# Patient Record
Sex: Female | Born: 1985 | Race: White | Hispanic: No | Smoking: Never smoker
Health system: Southern US, Community
[De-identification: ages and names within clinical notes are randomized; demographics above are authoritative.]

## PROBLEM LIST (undated history)

## (undated) HISTORY — PX: CHOLECYSTECTOMY: SHX55

---
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
## 2007-06-18 ENCOUNTER — Emergency Department: Payer: Self-pay | Admitting: Emergency Medicine

## 2008-05-13 ENCOUNTER — Emergency Department: Payer: Self-pay | Admitting: Emergency Medicine

## 2008-05-17 ENCOUNTER — Emergency Department: Payer: Self-pay | Admitting: Emergency Medicine

## 2008-06-16 ENCOUNTER — Emergency Department: Payer: Self-pay | Admitting: Emergency Medicine

## 2008-06-17 ENCOUNTER — Observation Stay: Payer: Self-pay | Admitting: Surgery

## 2008-11-06 ENCOUNTER — Emergency Department: Payer: Self-pay | Admitting: Internal Medicine

## 2009-05-17 ENCOUNTER — Emergency Department: Payer: Self-pay | Admitting: Emergency Medicine
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
## 2010-04-29 ENCOUNTER — Emergency Department: Payer: Self-pay | Admitting: Emergency Medicine

## 2011-01-24 ENCOUNTER — Ambulatory Visit: Payer: Self-pay | Admitting: Advanced Practice Midwife

## 2011-04-06 ENCOUNTER — Observation Stay: Payer: Self-pay

## 2011-05-28 ENCOUNTER — Observation Stay: Payer: Self-pay | Admitting: Obstetrics and Gynecology

## 2011-05-31 ENCOUNTER — Inpatient Hospital Stay: Payer: Self-pay | Admitting: Obstetrics and Gynecology

## 2011-06-10 LAB — PATHOLOGY REPORT

## 2012-08-22 ENCOUNTER — Emergency Department: Payer: Self-pay | Admitting: Emergency Medicine

## 2012-08-22 LAB — CBC
MCH: 27.5 pg (ref 26.0–34.0)
MCHC: 33.5 g/dL (ref 32.0–36.0)
MCV: 82 fL (ref 80–100)
Platelet: 204 10*3/uL (ref 150–440)
RBC: 4.27 10*6/uL (ref 3.80–5.20)
RDW: 13.9 % (ref 11.5–14.5)
WBC: 8.8 10*3/uL (ref 3.6–11.0)

## 2012-08-22 LAB — HCG, QUANTITATIVE, PREGNANCY: Beta Hcg, Quant.: 53693 m[IU]/mL — ABNORMAL HIGH

## 2012-11-05 ENCOUNTER — Ambulatory Visit: Payer: Self-pay | Admitting: Family Medicine

## 2013-01-11 ENCOUNTER — Observation Stay: Payer: Self-pay | Admitting: Obstetrics and Gynecology

## 2013-01-12 LAB — URINALYSIS, COMPLETE
Bacteria: NONE SEEN
Ketone: NEGATIVE
Leukocyte Esterase: NEGATIVE
Nitrite: NEGATIVE
Ph: 9 (ref 4.5–8.0)
Specific Gravity: 1.018 (ref 1.003–1.030)
WBC UR: 1 /HPF (ref 0–5)

## 2013-01-12 LAB — COMPREHENSIVE METABOLIC PANEL
BUN: 6 mg/dL — ABNORMAL LOW (ref 7–18)
Bilirubin,Total: 0.5 mg/dL (ref 0.2–1.0)
Chloride: 108 mmol/L — ABNORMAL HIGH (ref 98–107)
Co2: 24 mmol/L (ref 21–32)
EGFR (African American): >60
Osmolality: 273 (ref 275–301)
Potassium: 3.5 mmol/L (ref 3.5–5.1)
SGOT(AST): 52 U/L — ABNORMAL HIGH (ref 15–37)
SGPT (ALT): 32 U/L (ref 12–78)
Sodium: 138 mmol/L (ref 136–145)

## 2013-01-12 LAB — AMYLASE: Amylase: 33 U/L (ref 25–115)

## 2013-01-30 ENCOUNTER — Observation Stay: Payer: Self-pay | Admitting: Obstetrics and Gynecology

## 2013-01-31 LAB — URINALYSIS, COMPLETE
Ketone: NEGATIVE
Nitrite: NEGATIVE
Ph: 7 (ref 4.5–8.0)
RBC,UR: <1 /HPF (ref 0–5)

## 2013-02-11 ENCOUNTER — Observation Stay: Payer: Self-pay

## 2013-02-18 ENCOUNTER — Observation Stay: Payer: Self-pay | Admitting: Obstetrics and Gynecology

## 2013-03-04 ENCOUNTER — Observation Stay: Payer: Self-pay

## 2013-03-15 ENCOUNTER — Observation Stay: Payer: Self-pay

## 2013-03-16 ENCOUNTER — Inpatient Hospital Stay: Payer: Self-pay

## 2013-03-16 LAB — CBC WITH DIFFERENTIAL/PLATELET
Basophil #: 0 10*3/uL (ref 0.0–0.1)
Basophil %: 0.3 %
Eosinophil %: 2.7 %
HCT: 32.6 % — ABNORMAL LOW (ref 35.0–47.0)
Lymphocyte #: 1.8 10*3/uL (ref 1.0–3.6)
MCH: 25.1 pg — ABNORMAL LOW (ref 26.0–34.0)
Neutrophil #: 8.1 10*3/uL — ABNORMAL HIGH (ref 1.4–6.5)
RDW: 14.8 % — ABNORMAL HIGH (ref 11.5–14.5)
WBC: 10.8 10*3/uL (ref 3.6–11.0)

## 2013-03-17 LAB — PATHOLOGY REPORT

## 2013-03-17 LAB — HEMATOCRIT: HCT: 27.8 % — ABNORMAL LOW (ref 35.0–47.0)

## 2013-11-08 IMAGING — US US OB < 14 WEEKS - US OB TV
1 series · 14 of 28 positions shown · non-contrast
Comparison: none

REASON FOR EXAM: abd cramping/spotting
COMMENTS:

[Series 1: us ob < 14 weeks - us ob tv · 0.28mm/px · 14 of 108 slices shown]
[im 4/108]
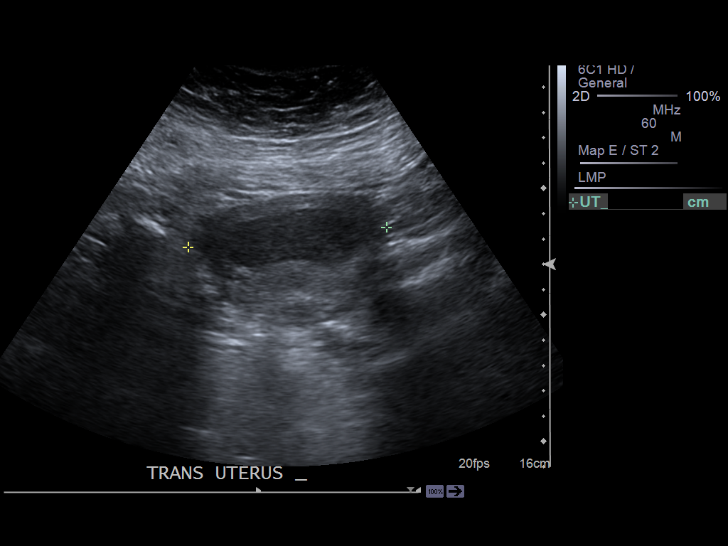
[im 12/108]
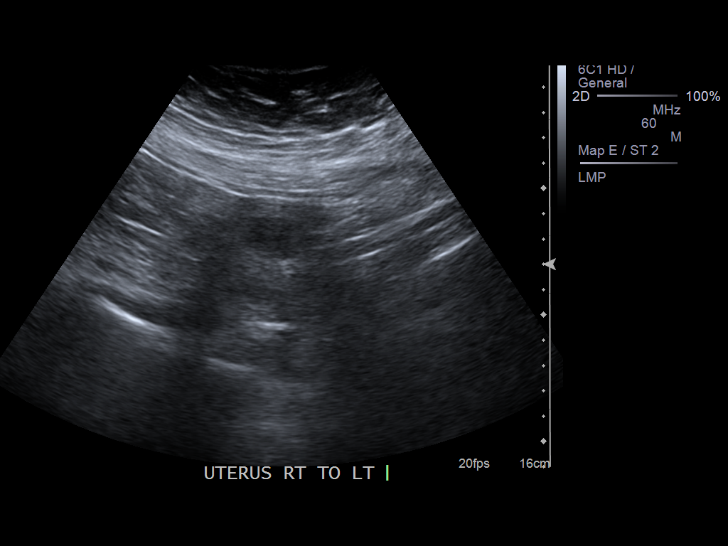
[im 20/108]
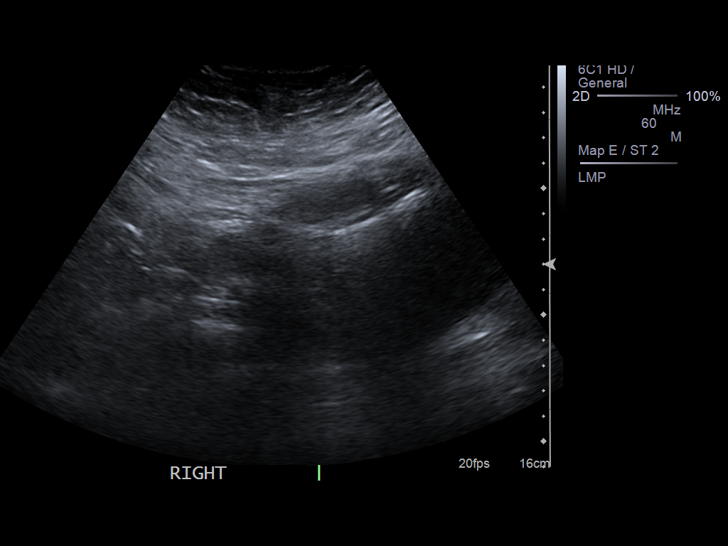
[im 28/108]
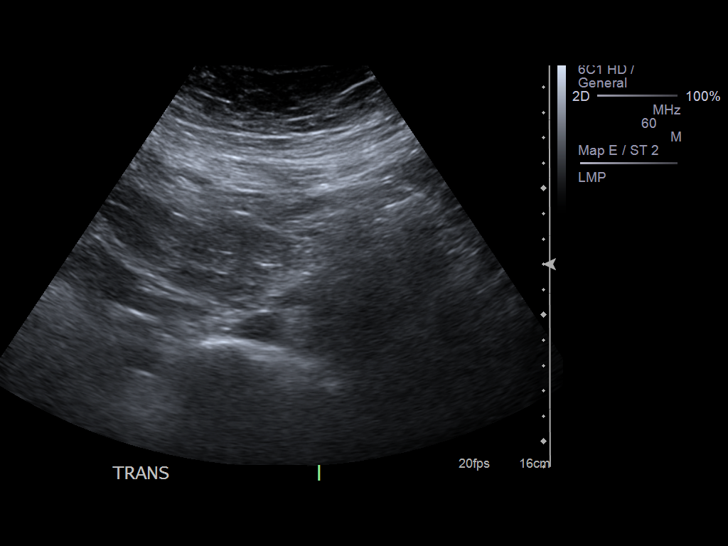
[im 36/108]
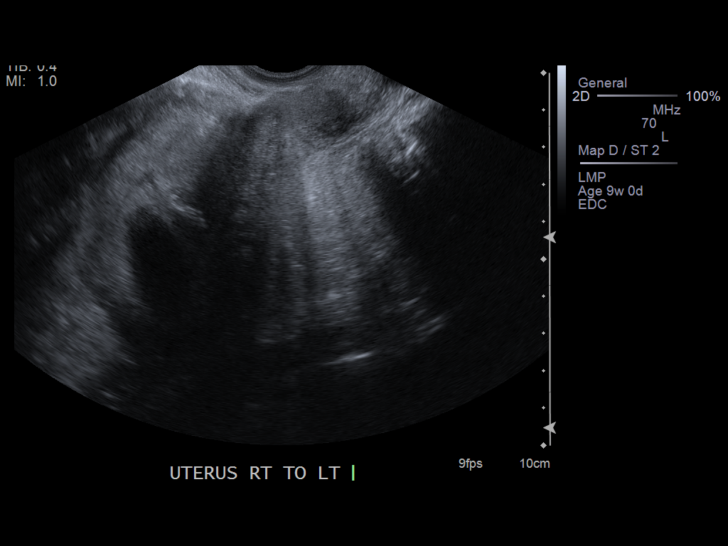
[im 44/108]
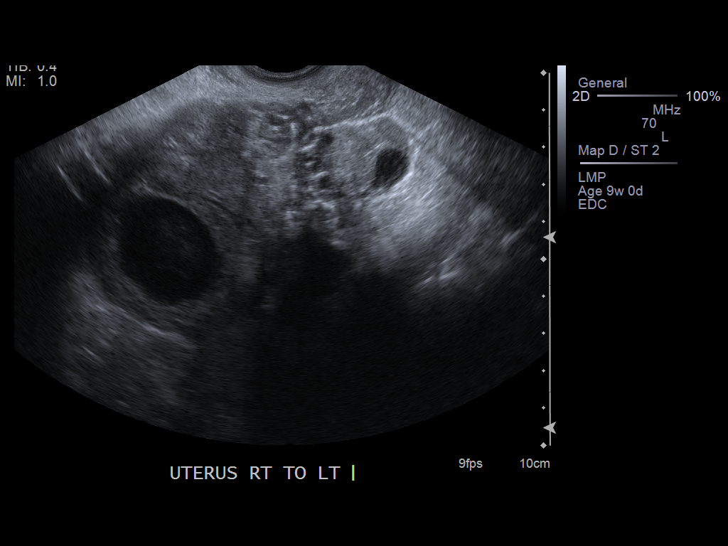
[im 52/108]
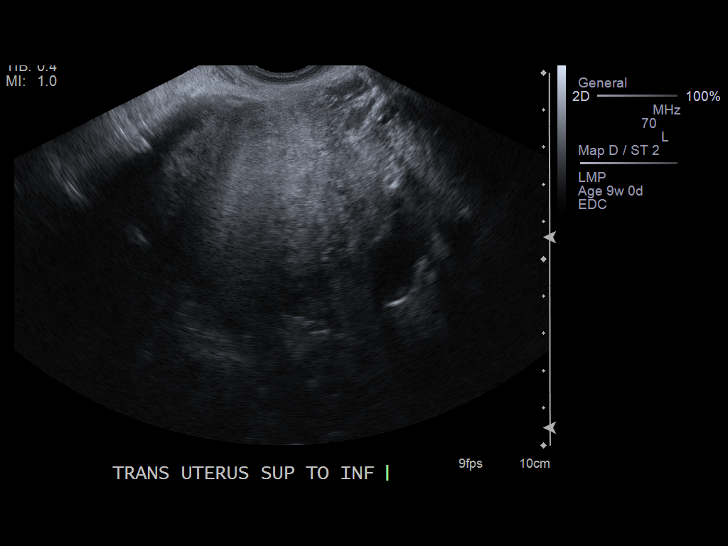
[im 60/108]
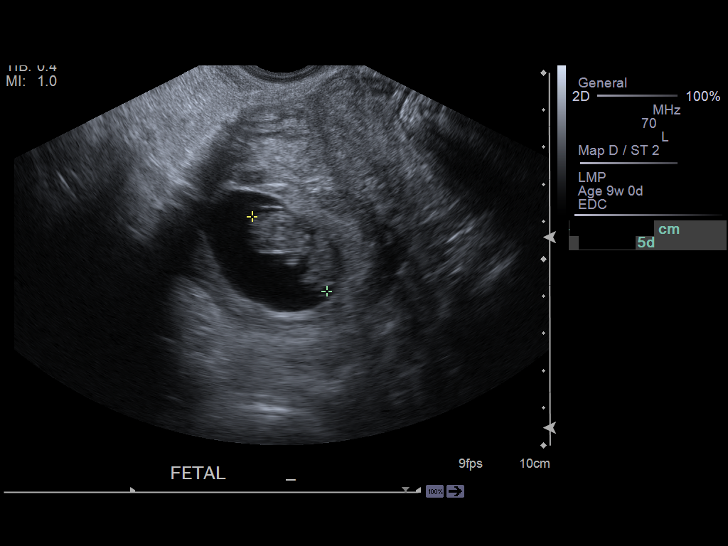
[im 68/108]
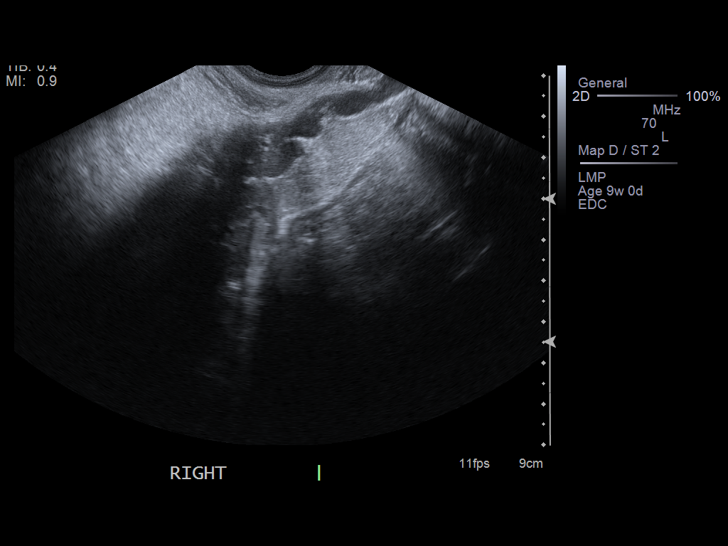
[im 76/108]
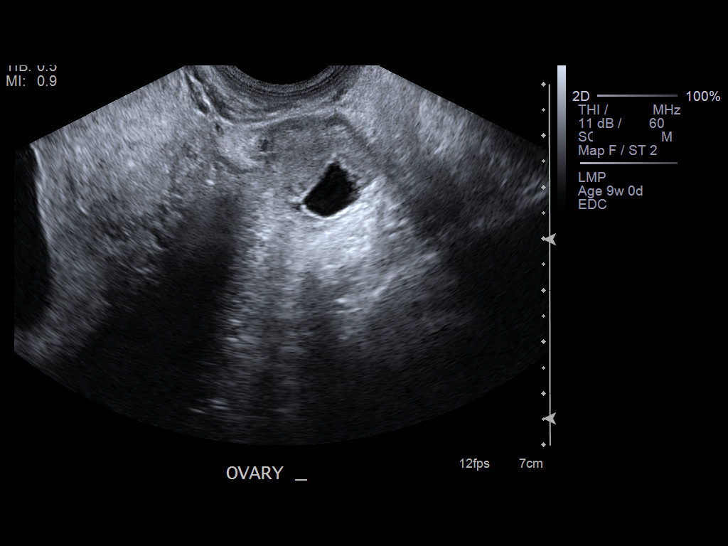
[im 84/108]
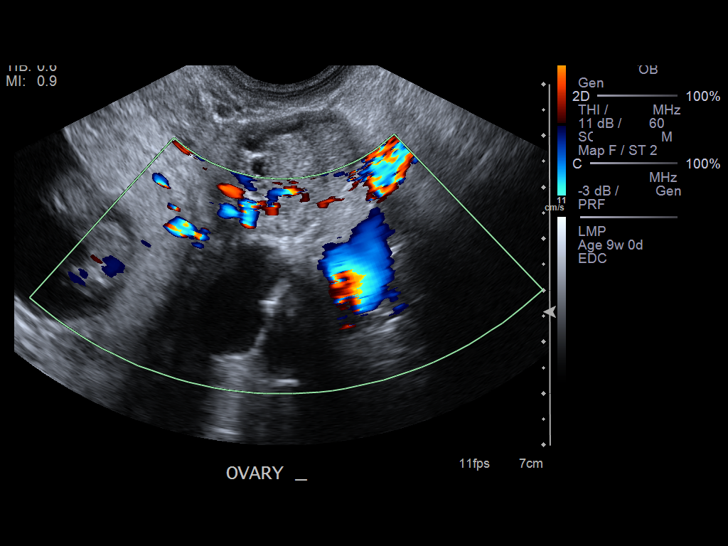
[im 92/108]
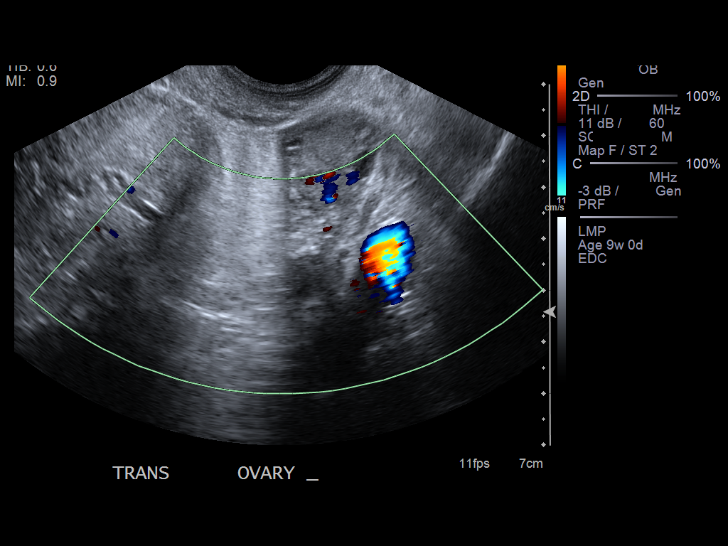
[im 100/108]
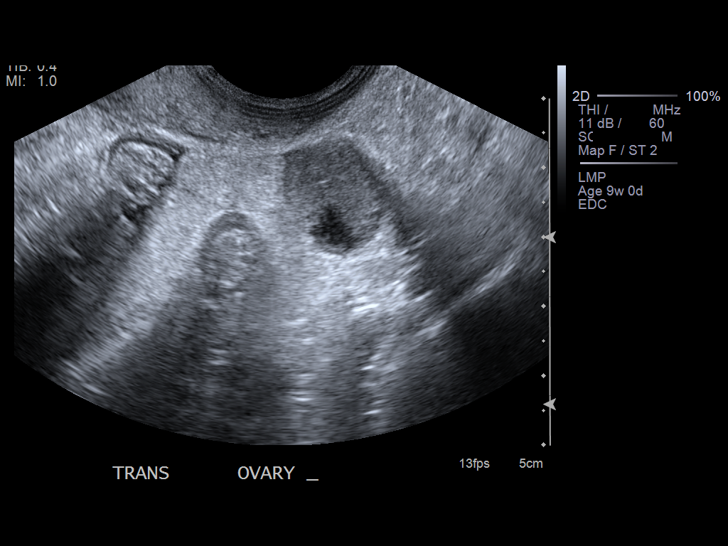
[im 108/108]
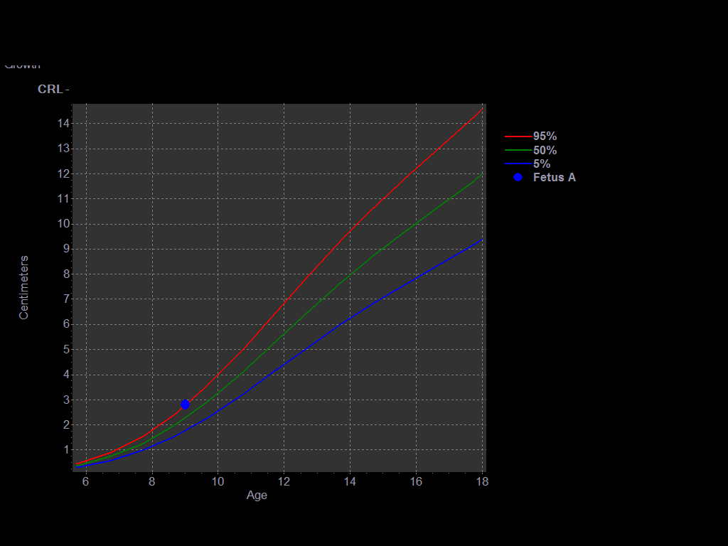

[14 of 28 positions shown; findings below may reference images not displayed]

PROCEDURE:     US  - US OB LESS THAN 14 WEEKS/W TRANS  - August 22, 2012  [DATE]

RESULT:     The patient reports being in the first trimester of pregnancy.
The patient's beta-hCG value is [DATE]. There is a gravid uterus present.
The crown right length measures an average of 2.8 cm corresponding to a 9
week 4 day gestation + / -6 days. A fetal cardiac rate of 153 beats per minute
is demonstrated.

The right ovary was not visualized. The left ovary measures 3.4 x 1.9 x
cm and exhibits a cystic structure which measures 2.7 cm in greatest
dimension. It does not exhibit rim hypervascularity. Survey views of the
kidneys are normal.
IMPRESSION: 1. There is an normal appearing viable IUP with estimated gestational age of
9 weeks 4 days + / -6 days. This corresponds to an estimated date of
confinement March 23, 2013.
2. There is a cyst associated with the left ovary. The right ovary was not
visualized.

[REDACTED]

## 2014-03-31 IMAGING — US ABDOMEN ULTRASOUND LIMITED
1 series · 14 of 25 positions shown · non-contrast
Comparison: none

REASON FOR EXAM: nausea, vomitting, upper abdominal pain
COMMENTS:   Body Site: GB and Fossa, CBD, Head of Pancreas

[Series 1: abdomen ultrasound limited · 0.31mm/px · 14 of 56 slices shown]
[im 1/56]
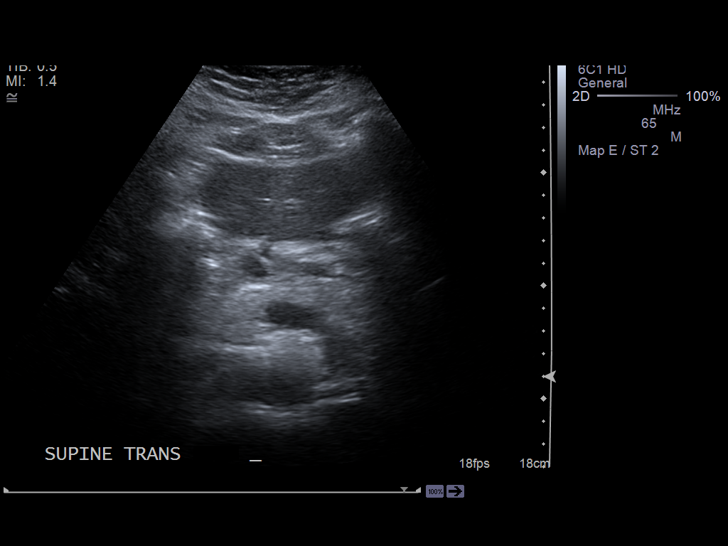
[im 5/56]
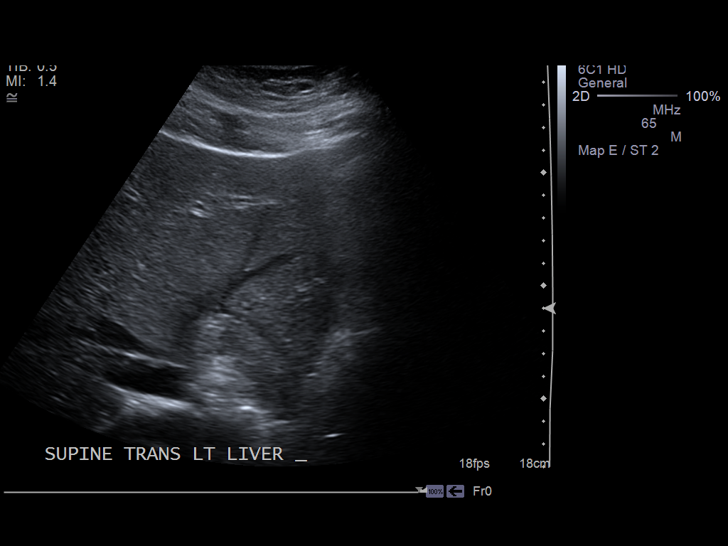
[im 10/56]
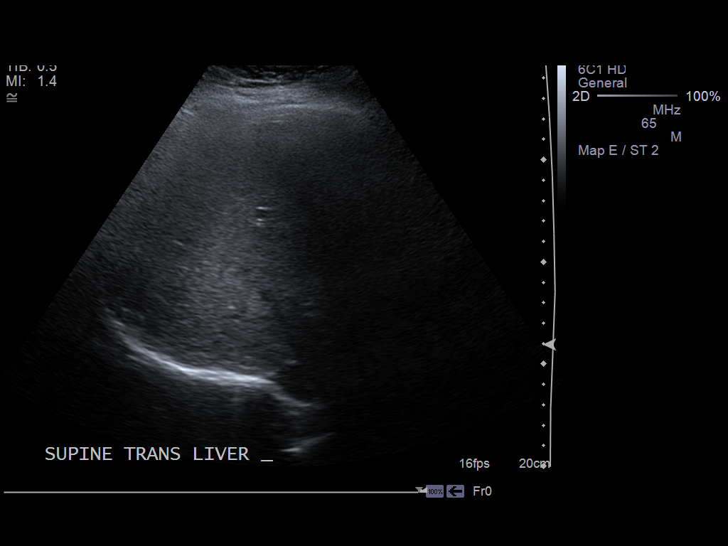
[im 14/56]
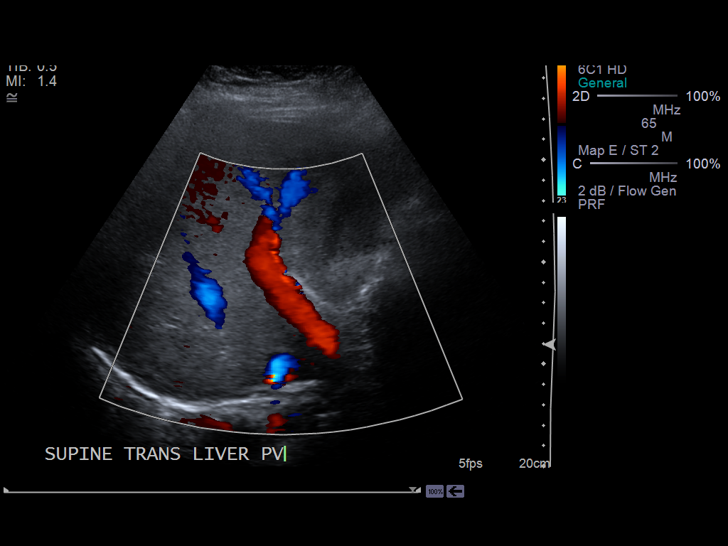
[im 19/56]
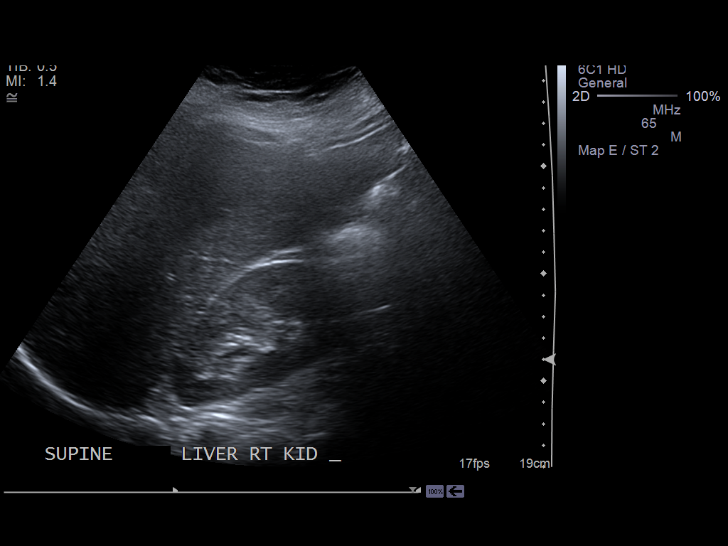
[im 21/56]
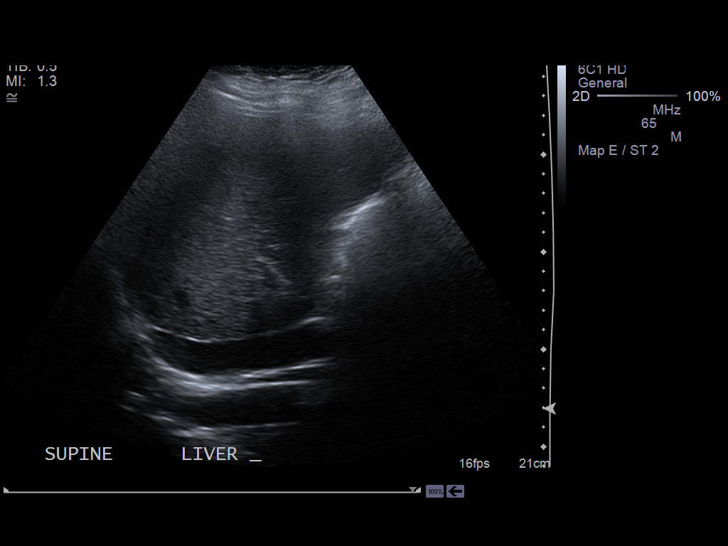
[im 26/56]
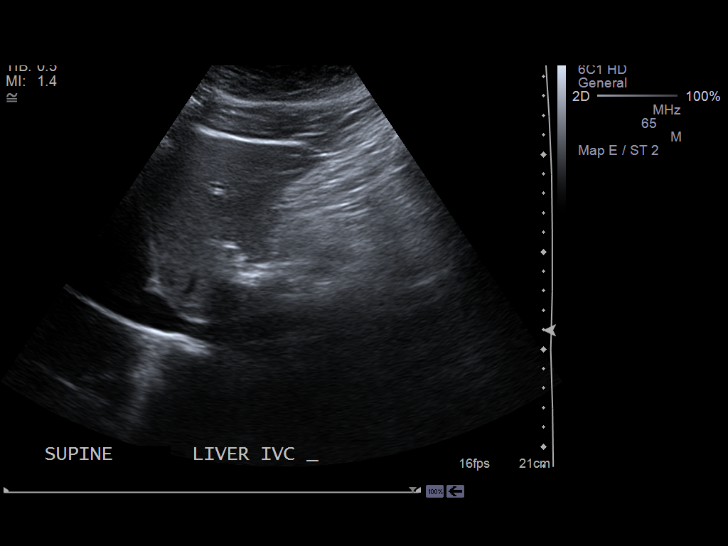
[im 30/56]
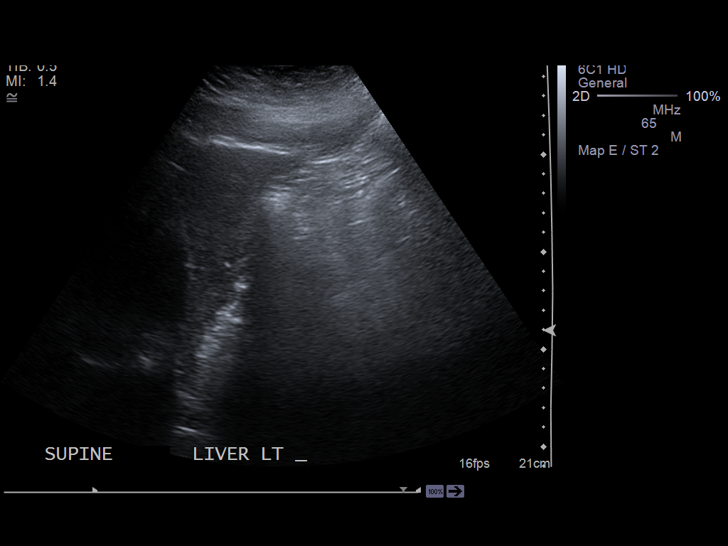
[im 35/56]
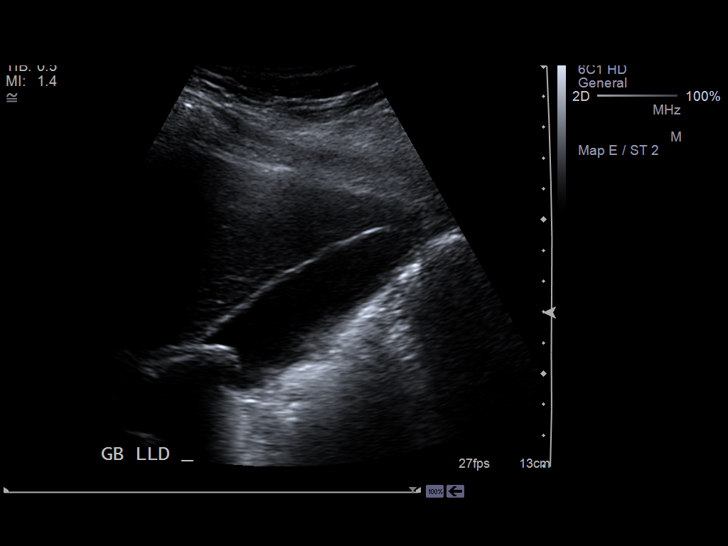
[im 37/56]
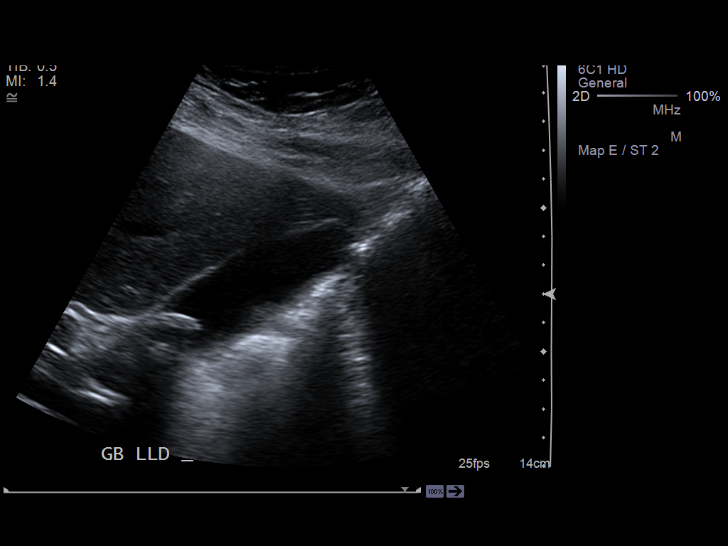
[im 42/56]
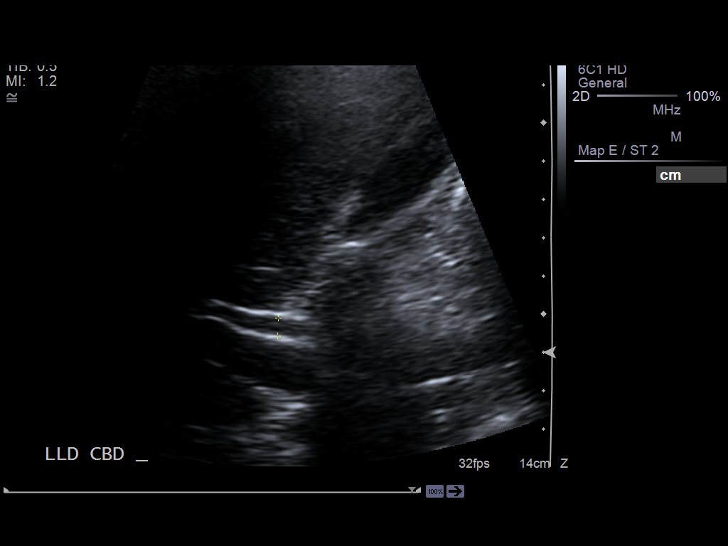
[im 46/56]
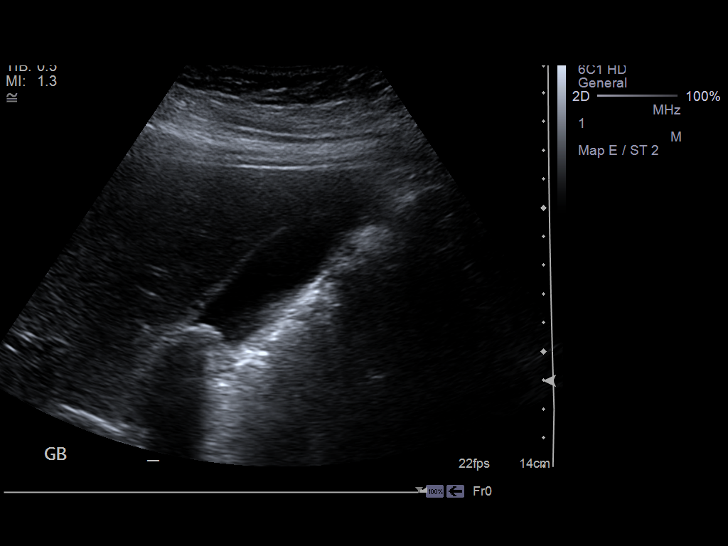
[im 51/56]
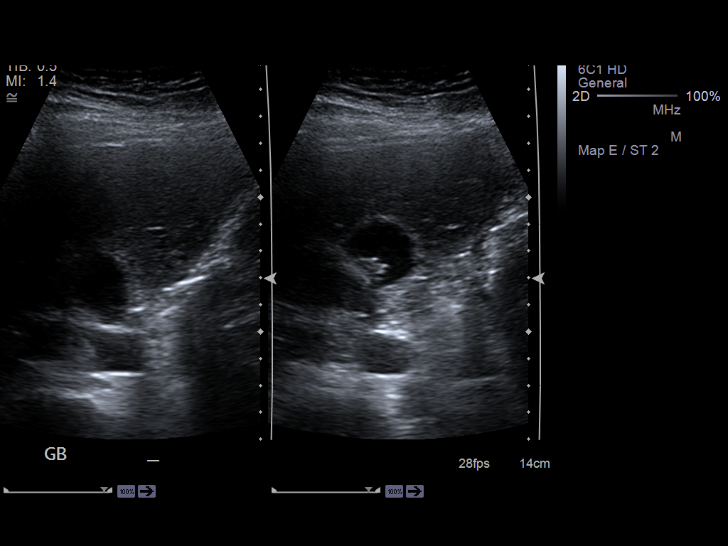
[im 56/56]
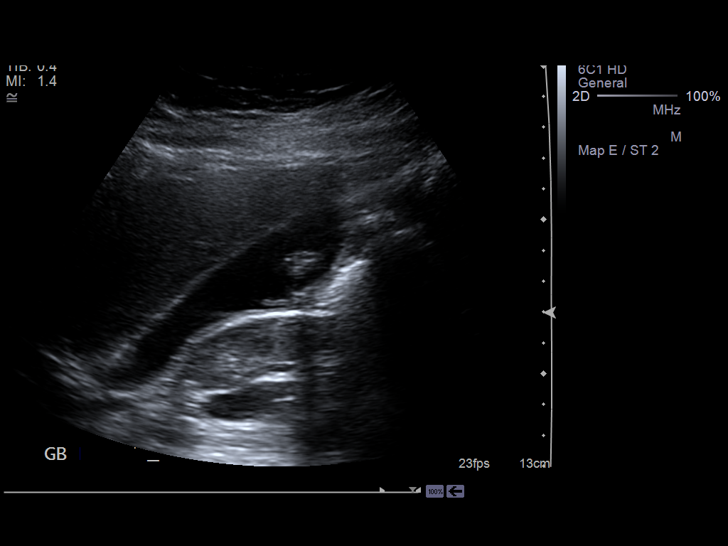

[14 of 25 positions shown; findings below may reference images not displayed]

PROCEDURE:     US  - US ABDOMEN LIMITED SURVEY  - January 12, 2013  [DATE]

RESULT:     Limited right upper quadrant abdominal sonogram is performed.
The patient has no previous study for comparison. The visualized portions of
the pancreas appear to be normal. The tail was not well demonstrated. The
portal venous flow is normal. The hepatic echotexture is normal. The liver
length is abnormal at 19.13 cm. There is no ascites demonstrated. Proximal
inferior vena cava is patent. Gallbladder wall thickness is 2.6 mm. There
appears to be at least one large stone in the gallbladder which is mobile
with changes in patient position measuring up to 1.2 cm in length. There is
a negative sonographic Murphy's sign. The common bile duct diameter is
mm. A second stone is also seen on some of the images.
IMPRESSION: Large gallstones present. No evidence of acute
cholecystitis. The liver is elongated and enlarged at 19.13 cm.

[REDACTED]

## 2015-02-24 NOTE — Op Note (Signed)
PATIENT NAME:  Sierra Ibarra, Sierra Ibarra MR#:  [id number] DATE OF BIRTH:  [birth year]  DATE OF PROCEDURE:  03/16/2013  PREOPERATIVE DIAGNOSES:  1. Elective repeat cesarean section.  2. Active labor.  3. Elective sterilization.   POSTOPERATIVE DIAGNOSES:  1. Elective repeat cesarean section.  2. Active labor.  3. Elective sterilization.   PROCEDURE:  1. Repeat low transverse cesarean section. 2. Partial left salpingectomy.   SURGEON: Suzy Bouchard, MD   FIRST ASSISTANT: Street, scrub tech.   ANESTHESIA: Spinal.   INDICATIONS: This is a 29 year old gravida 3, para 1 patient with a prior cesarean section scheduled for a cesarean section with the EDC of 03/23/2013. The patient presented to Labor and Delivery the day of the procedure in active labor, cervix 5 cm. The patient reconfirms the desire for permanent sterilization. The patient has had a prior partial right salpingectomy and is scheduled for a partial left salpingectomy.   DESCRIPTION OF PROCEDURE: After adequate spinal anesthesia, the patient was placed in the dorsal supine position, hip roll under the right side. The patient's abdomen was prepped and draped in normal sterile fashion. A Pfannenstiel incision was made two fingerbreadths above the symphysis pubis. Sharp dissection was used to identify the fascia. The fascia was opened in the midline and opened in a transverse fashion. The superior aspect of the fascia was grasped with Kocher clamps and the rectus muscles were dissected free. The inferior aspect of the fascia was grasped with Kocher clamps and pyramidalis muscle was dissected free. The inferior peritoneum was entered sharply. There were several strands of omentum adhesed to the anterior abdominal wall and the uterus. These were taken down with the Bovie. The vesicouterine peritoneal fold was identified. A small bladder flap was created and the bladder was reflected inferiorly. A low transverse uterine incision was  made. Upon entry into the endometrial cavity, clear fluid resulted. The incision was extended with blunt transverse traction. The fetal head was brought to the incision and the infant was delivered without difficulty. A vigorous female was passed to Dr. Beckie Salts who assigned Apgar scores of 9 and 9, weight 3,540 grams. Placenta was manually removed after getting cord blood and the uterus was exteriorized. The endometrial cavity was wiped clean with the laparotomy tape and the cervix was opened with a ring forceps and this was passed off the operative field. The uterine incision was closed with 1 chromic suture in a running locking fashion with good approximation of edges, good hemostasis noted.   Attention was directed to the patient's right fallopian tube where a prior partial salpingectomy was identified. This was known before the surgery. Therefore, surgeon proceeded to the left fallopian tube which was grasped at the mid-portion of the fallopian tube. Two separate 0 plain gut sutures were applied to the fallopian tube and a partial left salpingectomy was performed. Good hemostasis was noted. Ovaries appeared normal. Posterior cul-de-sac suctioned. The uterus was placed back into the abdominal cavity and the paracolic gutters were wiped clean with the laparotomy tape. Tubal ligation site again appeared hemostatic and the uterine incision appeared hemostatic. Interceed was placed over the uterine incision in a T-shape fashion and attention then was directed to the superior aspect of the fascia which was grasped with Kocher clamps and the On-Q pump catheters were placed from an infraumbilical position to subfascial position. The fascia was then closed over top of these without difficulty. Good hemostasis was noted. Subcutaneous tissues were irrigated and Bovied for hemostasis and the skin was reapproximated  with staples. The On-Q pump catheters were secured at the skin level with Dermabond, Steri-Stripped and  Tegaderm placed. Each catheter was loaded with 5 mL of 0.5% Marcaine. There were no complications. Estimated blood loss 600 mL. Intraoperative fluids 1,000 mL. The patient did receive 3 grams IV Ancef prior to commencement of the case and she did receive IV Pitocin after the delivery of the placenta. The patient was taken to the recovery room in good condition.   ____________________________ Suzy Bouchardhomas J. Schermerhorn, MD tjs:es D: 03/16/2013 10:05:00 ET T: 03/16/2013 10:28:10 ET JOB#: [id number]  cc: Suzy Bouchardhomas J. Schermerhorn, MD, <Dictator> Suzy BouchardHOMAS J SCHERMERHORN MD ELECTRONICALLY SIGNED 03/22/2013 13:58

## 2015-03-14 NOTE — H&P (Signed)
L&D Evaluation:  History:  HPI 29 y/o G3P1011 @ 39wks EDC 03/23/13 arrived during the night with c/o irregular contractions, denies leaking fluid or vaginal bleeding, baby active. Care @ [facility] Previous Csection with Rt salpingetomy, obesity, CMZ and GC RX'd, late entry to care HX Gallstones.   Presents with contractions   Patient's Medical History No Chronic Illness   Patient's Surgical History Previous C-Section   Medications Pre Natal Vitamins   Allergies PCN   Social History none   Family History Non-Contributory   ROS:  ROS All systems were reviewed.  HEENT, CNS, GI, GU, Respiratory, CV, Renal and Musculoskeletal systems were found to be normal.   Exam:  Vital Signs stable   Urine Protein not completed   General no apparent distress   Mental Status clear   Chest clear   Heart normal sinus rhythm   Abdomen gravid, non-tender   Estimated Fetal Weight Average for gestational age   Fetal Position vtx   Fundal Height term   Back no CVAT   Edema no edema   Clonus negative   Pelvic no external lesions, 5cm 90% vtx @ -2 BBOW sm show   Mebranes Intact   FHT normal rate with no decels, 120's 130's avg variability with accels   Fetal Heart Rate 135   Ucx irregular, Q 2/3 mins 45/60 sec moderate   Skin dry   Lymph no lymphadenopathy   Impression:  Impression active labor   Plan:  Plan EFM/NST, monitor contractions and for cervical change, fluids   Comments TJS notified, csection called. DC plan of care with patient and father of baby.Questions answered, what to expect with repeeat csection. DC Tubal ligation medicaid paper dated 02/18/13.   Electronic Signatures: Albertina ParrLugiano, Jeanna Giuffre B (CNM)  (Signed 13-May-14 08:34)  Authored: L&D Evaluation   Last Updated: 13-May-14 08:34 by Albertina ParrLugiano, Gissell Barra B (CNM)

## 2020-01-10 ENCOUNTER — Other Ambulatory Visit: Payer: Self-pay

## 2020-01-10 ENCOUNTER — Emergency Department
Admission: EM | Admit: 2020-01-10 | Discharge: 2020-01-10 | Discharge status: Against Medical Advice | Payer: Medicaid Other | Attending: Emergency Medicine | Admitting: Emergency Medicine

## 2020-01-10 DIAGNOSIS — R55 Syncope and collapse: Secondary | ICD-10-CM | POA: Diagnosis not present

## 2020-01-10 DIAGNOSIS — Z5321 Procedure and treatment not carried out due to patient leaving prior to being seen by health care provider: Secondary | ICD-10-CM | POA: Insufficient documentation

## 2020-01-10 LAB — BASIC METABOLIC PANEL
Anion gap: 8 (ref 5–15)
BUN: 9 mg/dL (ref 6–20)
CO2: 22 mmol/L (ref 22–32)
Calcium: 8.6 mg/dL — ABNORMAL LOW (ref 8.9–10.3)
Chloride: 105 mmol/L (ref 98–111)
Creatinine, Ser: 0.53 mg/dL (ref 0.44–1.00)
GFR calc Af Amer: >60 mL/min (ref >60)
GFR calc non Af Amer: >60 mL/min (ref >60)
Glucose, Bld: 121 mg/dL — ABNORMAL HIGH (ref 70–99)
Potassium: 3.5 mmol/L (ref 3.5–5.1)
Sodium: 135 mmol/L (ref 135–145)

## 2020-01-10 LAB — CBC
HCT: 34.8 % — ABNORMAL LOW (ref 36.0–46.0)
Hemoglobin: 10.9 g/dL — ABNORMAL LOW (ref 12.0–15.0)
MCH: 24.9 pg — ABNORMAL LOW (ref 26.0–34.0)
MCHC: 31.3 g/dL (ref 30.0–36.0)
MCV: 79.5 fL — ABNORMAL LOW (ref 80.0–100.0)
Platelets: 264 10*3/uL (ref 150–400)
RBC: 4.38 MIL/uL (ref 3.87–5.11)
RDW: 13.7 % (ref 11.5–15.5)
WBC: 7 10*3/uL (ref 4.0–10.5)
nRBC: 0 % (ref 0.0–0.2)
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
## 2020-01-10 NOTE — ED Notes (Signed)
No answer when called several times from lobby 

## 2020-01-10 NOTE — ED Notes (Signed)
No answer when called several times from lobby; attempted to call phone with no answer as well

## 2020-01-13 ENCOUNTER — Telehealth: Payer: Self-pay | Admitting: Emergency Medicine

## 2020-01-13 NOTE — Telephone Encounter (Signed)
Called patient due to lwot to inquire about condition and follow up plans. Left message.   

## 2020-06-27 ENCOUNTER — Emergency Department: Payer: Medicaid Other

## 2020-06-27 ENCOUNTER — Encounter: Payer: Self-pay | Admitting: Emergency Medicine

## 2020-06-27 ENCOUNTER — Other Ambulatory Visit: Payer: Self-pay

## 2020-06-27 ENCOUNTER — Emergency Department
Admission: EM | Admit: 2020-06-27 | Discharge: 2020-06-27 | Disposition: A | Discharge status: Home / Self Care | Payer: Medicaid Other | Attending: Emergency Medicine | Admitting: Emergency Medicine

## 2020-06-27 DIAGNOSIS — U071 COVID-19: Secondary | ICD-10-CM | POA: Insufficient documentation

## 2020-06-27 DIAGNOSIS — R079 Chest pain, unspecified: Secondary | ICD-10-CM | POA: Diagnosis not present

## 2020-06-27 LAB — COMPREHENSIVE METABOLIC PANEL
ALT: 15 U/L (ref 0–44)
AST: 23 U/L (ref 15–41)
Albumin: 3.8 g/dL (ref 3.5–5.0)
Alkaline Phosphatase: 45 U/L (ref 38–126)
Anion gap: 8 (ref 5–15)
BUN: 9 mg/dL (ref 6–20)
CO2: 26 mmol/L (ref 22–32)
Calcium: 8.2 mg/dL — ABNORMAL LOW (ref 8.9–10.3)
Chloride: 105 mmol/L (ref 98–111)
Creatinine, Ser: 0.55 mg/dL (ref 0.44–1.00)
GFR calc Af Amer: >60 mL/min (ref >60)
GFR calc non Af Amer: >60 mL/min (ref >60)
Glucose, Bld: 122 mg/dL — ABNORMAL HIGH (ref 70–99)
Potassium: 3.4 mmol/L — ABNORMAL LOW (ref 3.5–5.1)
Sodium: 139 mmol/L (ref 135–145)
Total Bilirubin: 0.5 mg/dL (ref 0.3–1.2)
Total Protein: 7.2 g/dL (ref 6.5–8.1)

## 2020-06-27 LAB — CBC WITH DIFFERENTIAL/PLATELET
Abs Immature Granulocytes: 0.02 10*3/uL (ref 0.00–0.07)
Basophils Absolute: 0 10*3/uL (ref 0.0–0.1)
Basophils Relative: 1 %
Eosinophils Absolute: 0.2 10*3/uL (ref 0.0–0.5)
Eosinophils Relative: 4 %
HCT: 33.6 % — ABNORMAL LOW (ref 36.0–46.0)
Hemoglobin: 10.7 g/dL — ABNORMAL LOW (ref 12.0–15.0)
Immature Granulocytes: 1 %
Lymphocytes Relative: 27 %
Lymphs Abs: 1 10*3/uL (ref 0.7–4.0)
MCH: 24.9 pg — ABNORMAL LOW (ref 26.0–34.0)
MCHC: 31.8 g/dL (ref 30.0–36.0)
MCV: 78.3 fL — ABNORMAL LOW (ref 80.0–100.0)
Monocytes Absolute: 0.3 10*3/uL (ref 0.1–1.0)
Monocytes Relative: 7 %
Neutro Abs: 2.3 10*3/uL (ref 1.7–7.7)
Neutrophils Relative %: 60 %
Platelets: 202 10*3/uL (ref 150–400)
RBC: 4.29 MIL/uL (ref 3.87–5.11)
RDW: 14.5 % (ref 11.5–15.5)
WBC: 3.8 10*3/uL — ABNORMAL LOW (ref 4.0–10.5)
nRBC: 0 % (ref 0.0–0.2)

## 2020-06-27 LAB — TROPONIN I (HIGH SENSITIVITY): Troponin I (High Sensitivity): <2 ng/L (ref <18)

## 2020-06-27 MED ORDER — GUAIFENESIN-CODEINE 100-10 MG/5ML PO SOLN: 5.0000 mL | Freq: Four times a day (QID) | ORAL | 0 refills | Status: DC | PRN

## 2020-06-27 MED ORDER — ONDANSETRON HCL 4 MG PO TABS: 4.0000 mg | ORAL_TABLET | Freq: Every day | ORAL | 0 refills | Status: DC | PRN

## 2020-06-27 NOTE — ED Notes (Signed)
Tested positive COVID 06/26/20
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
## 2020-06-27 NOTE — ED Notes (Signed)
ED Provider at bedside. 

## 2020-06-27 NOTE — ED Provider Notes (Signed)
Sierra Ibarra ([place]) Emergency Department Provider Note  Time seen: 8:30 AM  I have reviewed the triage vital signs and the nursing notes.   HISTORY  Chief Complaint Chest Pain   HPI Sierra Ibarra is a 34 y.o. female with no past medical history presents to the emergency department for chest discomfort and mild shortness of breath. Patient tested positive for Covid recently.  According to the patient he had a known close exposure at work and everyone in the office was tested. Patient was informed yesterday she tested positive. Patient states she started having some chest pain and shortness of breath last night so she came to the emergency department today for evaluation. Denies any significant cough denies any nausea vomiting no abdominal pain.  History reviewed. No pertinent past medical history.  There are no problems to display for this patient.   History reviewed. No pertinent surgical history.  Prior to Admission medications   Not on File    Allergies  Allergen Reactions  . Amoxicillin   . Penicillins     No family history on file.  Social History Social History   Tobacco Use  . Smoking status: Never Smoker  . Smokeless tobacco: Never Used  Vaping Use  . Vaping Use: Never used  Substance Use Topics  . Alcohol use: Yes    Comment: occasional   . Drug use: Yes    Types: Marijuana    Review of Systems Constitutional: Negative for fever. Cardiovascular: Mild chest discomfort/tightness Respiratory: Mild shortness of breath Gastrointestinal: Negative for abdominal pain, vomiting Musculoskeletal: Negative for musculoskeletal complaints Skin: Negative for skin complaints  Neurological: Negative for headache All other ROS negative  ____________________________________________   PHYSICAL EXAM:  VITAL SIGNS: ED Triage Vitals  Enc Vitals Group     BP 06/27/20 0545 117/72     Pulse Rate 06/27/20 0545 72     Resp 06/27/20 0545 18      Temp 06/27/20 0545 97.9 F (36.6 C)     Temp Source 06/27/20 0545 Oral     SpO2 06/27/20 0545 99 %     Weight 06/27/20 0546 240 lb (108.9 kg)     Height 06/27/20 0546 5\' 2"  (1.575 m)     Head Circumference --      Peak Flow --      Pain Score 06/27/20 0545 5     Pain Loc --      Pain Edu? --      Excl. in GC? --     Constitutional: Alert and oriented. Well appearing and in no distress. Eyes: Normal exam ENT      Head: Normocephalic and atraumatic.      Mouth/Throat: Mucous membranes are moist. Cardiovascular: Normal rate, regular rhythm. No murmur Respiratory: Normal respiratory effort without tachypnea nor retractions. Breath sounds are clear  Gastrointestinal: Soft and nontender. No distention.  Musculoskeletal: Nontender with normal range of motion in all extremities.  Neurologic:  Normal speech and language. No gross focal neurologic deficits  Skin:  Skin is warm, dry and intact.  Psychiatric: Mood and affect are normal. Speech and behavior are normal.   ____________________________________________    EKG  EKG viewed and interpreted by myself shows a normal sinus rhythm at 73 bpm with a narrow QRS, normal axis, normal intervals, no concerning ST changes. Normal EKG.  ____________________________________________    RADIOLOGY  Chest x-ray is clear  ____________________________________________   INITIAL IMPRESSION / ASSESSMENT AND PLAN / ED COURSE  Pertinent labs &  imaging results that were available during my care of the patient were reviewed by me and considered in my medical decision making (see chart for details).   Patient presents to the emergency department for mild chest discomfort/tightness and shortness of breath since yesterday. Found out she was Covid positive yesterday. Overall the patient appears very well, clear lung sounds, reassuring vitals, clear chest x-ray normal labs including a negative troponin and a reassuring EKG. Given the patient's negative  work-up I suspect her symptoms are likely related to her recent Covid diagnosis. We will place the patient on Zofran and codeine-based cough medication to help with her symptoms as they develop. I discussed return precautions as well as quarantining.  Sierra Ibarra was evaluated in Emergency Department on 06/27/2020 for the symptoms described in the history of present illness. She was evaluated in the context of the global COVID-19 pandemic, which necessitated consideration that the patient might be at risk for infection with the SARS-CoV-2 virus that causes COVID-19. Institutional protocols and algorithms that pertain to the evaluation of patients at risk for COVID-19 are in a state of rapid change based on information released by regulatory bodies including the CDC and federal and state organizations. These policies and algorithms were followed during the patient's care in the ED.  ____________________________________________   FINAL CLINICAL IMPRESSION(S) / ED DIAGNOSES  COVID-19 Chest pain   Sierra Antis, MD 06/27/20 [phone]

## 2022-04-12 ENCOUNTER — Other Ambulatory Visit: Payer: Self-pay

## 2022-04-12 ENCOUNTER — Emergency Department: Payer: Medicaid Other

## 2022-04-12 ENCOUNTER — Encounter
Admission: EM | Disposition: A | Discharge status: Home / Self Care | Payer: Self-pay | Source: Home / Self Care | Attending: Surgery

## 2022-04-12 ENCOUNTER — Inpatient Hospital Stay
Admission: EM | Admit: 2022-04-12 | Discharge: 2022-04-14 | DRG: 418 | Disposition: A | Discharge status: Home / Self Care | Payer: Medicaid Other | Attending: Surgery | Admitting: Surgery

## 2022-04-12 ENCOUNTER — Emergency Department: Payer: Medicaid Other | Admitting: Certified Registered"

## 2022-04-12 DIAGNOSIS — K8021 Calculus of gallbladder without cholecystitis with obstruction: Secondary | ICD-10-CM

## 2022-04-12 DIAGNOSIS — K8000 Calculus of gallbladder with acute cholecystitis without obstruction: Secondary | ICD-10-CM | POA: Diagnosis not present

## 2022-04-12 DIAGNOSIS — R160 Hepatomegaly, not elsewhere classified: Secondary | ICD-10-CM | POA: Diagnosis present

## 2022-04-12 DIAGNOSIS — K821 Hydrops of gallbladder: Secondary | ICD-10-CM | POA: Diagnosis present

## 2022-04-12 DIAGNOSIS — K810 Acute cholecystitis: Principal | ICD-10-CM | POA: Diagnosis present

## 2022-04-12 DIAGNOSIS — K760 Fatty (change of) liver, not elsewhere classified: Secondary | ICD-10-CM | POA: Diagnosis present

## 2022-04-12 DIAGNOSIS — Z6841 Body Mass Index (BMI) 40.0 and over, adult: Secondary | ICD-10-CM

## 2022-04-12 DIAGNOSIS — Z98891 History of uterine scar from previous surgery: Secondary | ICD-10-CM

## 2022-04-12 LAB — LIPASE, BLOOD: Lipase: 24 U/L (ref 11–51)

## 2022-04-12 LAB — URINALYSIS, ROUTINE W REFLEX MICROSCOPIC
Bilirubin Urine: NEGATIVE
Glucose, UA: NEGATIVE mg/dL
Ketones, ur: NEGATIVE mg/dL
Leukocytes,Ua: NEGATIVE
Nitrite: NEGATIVE
Protein, ur: NEGATIVE mg/dL
Specific Gravity, Urine: 1.008 (ref 1.005–1.030)
pH: 7 (ref 5.0–8.0)

## 2022-04-12 LAB — COMPREHENSIVE METABOLIC PANEL
ALT: 141 U/L — ABNORMAL HIGH (ref 0–44)
AST: 78 U/L — ABNORMAL HIGH (ref 15–41)
Albumin: 3.9 g/dL (ref 3.5–5.0)
Alkaline Phosphatase: 91 U/L (ref 38–126)
Anion gap: 6 (ref 5–15)
BUN: 9 mg/dL (ref 6–20)
CO2: 28 mmol/L (ref 22–32)
Calcium: 9 mg/dL (ref 8.9–10.3)
Chloride: 103 mmol/L (ref 98–111)
Creatinine, Ser: 0.5 mg/dL (ref 0.44–1.00)
GFR, Estimated: >60 mL/min (ref >60)
Glucose, Bld: 135 mg/dL — ABNORMAL HIGH (ref 70–99)
Potassium: 4 mmol/L (ref 3.5–5.1)
Sodium: 137 mmol/L (ref 135–145)
Total Bilirubin: 0.8 mg/dL (ref 0.3–1.2)
Total Protein: 7.5 g/dL (ref 6.5–8.1)

## 2022-04-12 LAB — POC URINE PREG, ED: Preg Test, Ur: NEGATIVE

## 2022-04-12 LAB — CBC
HCT: 35.2 % — ABNORMAL LOW (ref 36.0–46.0)
Hemoglobin: 11.3 g/dL — ABNORMAL LOW (ref 12.0–15.0)
MCH: 26 pg (ref 26.0–34.0)
MCHC: 32.1 g/dL (ref 30.0–36.0)
MCV: 81.1 fL (ref 80.0–100.0)
Platelets: 236 10*3/uL (ref 150–400)
RBC: 4.34 MIL/uL (ref 3.87–5.11)
RDW: 14 % (ref 11.5–15.5)
WBC: 12 10*3/uL — ABNORMAL HIGH (ref 4.0–10.5)
nRBC: 0 % (ref 0.0–0.2)

## 2022-04-12 SURGERY — CHOLECYSTECTOMY, ROBOT-ASSISTED, LAPAROSCOPIC
Anesthesia: General | Site: Abdomen

## 2022-04-12 MED ORDER — ROCURONIUM BROMIDE 10 MG/ML (PF) SYRINGE
PREFILLED_SYRINGE | INTRAVENOUS | Status: AC
  Filled 2022-04-12: qty 10

## 2022-04-12 MED ORDER — PROPOFOL 10 MG/ML IV BOLUS
INTRAVENOUS | Status: DC | PRN
  Administered 2022-04-12: 180 mg via INTRAVENOUS

## 2022-04-12 MED ORDER — PROPOFOL 10 MG/ML IV BOLUS
INTRAVENOUS | Status: AC
  Filled 2022-04-12: qty 20

## 2022-04-12 MED ORDER — MORPHINE SULFATE (PF) 4 MG/ML IV SOLN
4.0000 mg | INTRAVENOUS | Status: AC
  Administered 2022-04-12: 4 mg via INTRAVENOUS
  Filled 2022-04-12: qty 1

## 2022-04-12 MED ORDER — ROCURONIUM BROMIDE 100 MG/10ML IV SOLN
INTRAVENOUS | Status: DC | PRN
  Administered 2022-04-12: 50 mg via INTRAVENOUS

## 2022-04-12 MED ORDER — ONDANSETRON HCL 4 MG/2ML IJ SOLN
INTRAMUSCULAR | Status: DC | PRN
  Administered 2022-04-12: 4 mg via INTRAVENOUS

## 2022-04-12 MED ORDER — METHOCARBAMOL 500 MG PO TABS: 500.0000 mg | ORAL_TABLET | Freq: Three times a day (TID) | ORAL | Status: DC | PRN

## 2022-04-12 MED ORDER — 0.9 % SODIUM CHLORIDE (POUR BTL) OPTIME
TOPICAL | Status: DC | PRN
  Administered 2022-04-12: 500 mL

## 2022-04-12 MED ORDER — DIPHENHYDRAMINE HCL 50 MG/ML IJ SOLN: 25.0000 mg | Freq: Four times a day (QID) | INTRAMUSCULAR | Status: DC | PRN

## 2022-04-12 MED ORDER — SUGAMMADEX SODIUM 200 MG/2ML IV SOLN
INTRAVENOUS | Status: DC | PRN
  Administered 2022-04-12: 300 mg via INTRAVENOUS

## 2022-04-12 MED ORDER — PROCHLORPERAZINE EDISYLATE 10 MG/2ML IJ SOLN: 5.0000 mg | Freq: Four times a day (QID) | INTRAMUSCULAR | Status: DC | PRN

## 2022-04-12 MED ORDER — ONDANSETRON HCL 4 MG/2ML IJ SOLN
4.0000 mg | Freq: Four times a day (QID) | INTRAMUSCULAR | Status: DC | PRN
  Administered 2022-04-12 – 2022-04-13 (×2): 4 mg via INTRAVENOUS
  Filled 2022-04-12 (×2): qty 2

## 2022-04-12 MED ORDER — BUPIVACAINE LIPOSOME 1.3 % IJ SUSP
INTRAMUSCULAR | Status: AC
  Filled 2022-04-12: qty 20

## 2022-04-12 MED ORDER — SODIUM CHLORIDE 0.9 % IV BOLUS
1000.0000 mL | Freq: Once | INTRAVENOUS | Status: AC
Start: 2022-04-12 — End: 2022-04-12
  Administered 2022-04-12: 1000 mL via INTRAVENOUS

## 2022-04-12 MED ORDER — MORPHINE SULFATE (PF) 4 MG/ML IV SOLN
4.0000 mg | Freq: Once | INTRAVENOUS | Status: AC
  Administered 2022-04-12: 4 mg via INTRAVENOUS
  Filled 2022-04-12: qty 1

## 2022-04-12 MED ORDER — SUCCINYLCHOLINE CHLORIDE 200 MG/10ML IV SOSY
PREFILLED_SYRINGE | INTRAVENOUS | Status: AC
  Filled 2022-04-12: qty 10

## 2022-04-12 MED ORDER — PANTOPRAZOLE SODIUM 40 MG IV SOLR
40.0000 mg | Freq: Every day | INTRAVENOUS | Status: DC
  Administered 2022-04-12 – 2022-04-13 (×2): 40 mg via INTRAVENOUS
  Filled 2022-04-12 (×3): qty 10

## 2022-04-12 MED ORDER — ONDANSETRON HCL 4 MG/2ML IJ SOLN: 4.0000 mg | Freq: Once | INTRAMUSCULAR | Status: DC | PRN

## 2022-04-12 MED ORDER — LIDOCAINE HCL (PF) 2 % IJ SOLN
INTRAMUSCULAR | Status: AC
  Filled 2022-04-12: qty 5

## 2022-04-12 MED ORDER — ENOXAPARIN SODIUM 40 MG/0.4ML IJ SOSY
40.0000 mg | PREFILLED_SYRINGE | INTRAMUSCULAR | Status: DC
  Administered 2022-04-13 – 2022-04-14 (×2): 40 mg via SUBCUTANEOUS
  Filled 2022-04-12 (×2): qty 0.4

## 2022-04-12 MED ORDER — SODIUM CHLORIDE 0.9 % IV SOLN: INTRAVENOUS | Status: DC

## 2022-04-12 MED ORDER — MIDAZOLAM HCL 2 MG/2ML IJ SOLN
INTRAMUSCULAR | Status: DC | PRN
  Administered 2022-04-12: 2 mg via INTRAVENOUS

## 2022-04-12 MED ORDER — SODIUM CHLORIDE 0.9 % IV SOLN
2.0000 g | Freq: Three times a day (TID) | INTRAVENOUS | Status: DC
  Administered 2022-04-12 – 2022-04-14 (×5): 2 g via INTRAVENOUS
  Filled 2022-04-12: qty 12.5
  Filled 2022-04-12 (×2): qty 2
  Filled 2022-04-12: qty 12.5
  Filled 2022-04-12 (×3): qty 2

## 2022-04-12 MED ORDER — OXYCODONE HCL 5 MG PO TABS
5.0000 mg | ORAL_TABLET | ORAL | Status: DC | PRN
  Administered 2022-04-12 – 2022-04-13 (×4): 5 mg via ORAL
  Filled 2022-04-12 (×4): qty 1

## 2022-04-12 MED ORDER — ONDANSETRON HCL 4 MG/2ML IJ SOLN
INTRAMUSCULAR | Status: AC
  Filled 2022-04-12: qty 2

## 2022-04-12 MED ORDER — DEXAMETHASONE SODIUM PHOSPHATE 10 MG/ML IJ SOLN
INTRAMUSCULAR | Status: AC
  Filled 2022-04-12: qty 1

## 2022-04-12 MED ORDER — SUCCINYLCHOLINE CHLORIDE 200 MG/10ML IV SOSY
PREFILLED_SYRINGE | INTRAVENOUS | Status: DC | PRN
  Administered 2022-04-12: 120 mg via INTRAVENOUS

## 2022-04-12 MED ORDER — SUGAMMADEX SODIUM 500 MG/5ML IV SOLN
INTRAVENOUS | Status: AC
  Filled 2022-04-12: qty 5

## 2022-04-12 MED ORDER — HYDROMORPHONE HCL 1 MG/ML IJ SOLN: 0.2500 mg | INTRAMUSCULAR | Status: DC | PRN

## 2022-04-12 MED ORDER — MIDAZOLAM HCL 2 MG/2ML IJ SOLN
INTRAMUSCULAR | Status: AC
  Filled 2022-04-12: qty 2

## 2022-04-12 MED ORDER — PROMETHAZINE HCL 25 MG/ML IJ SOLN: 6.2500 mg | INTRAMUSCULAR | Status: DC | PRN

## 2022-04-12 MED ORDER — METRONIDAZOLE 500 MG/100ML IV SOLN
500.0000 mg | Freq: Two times a day (BID) | INTRAVENOUS | Status: DC
  Administered 2022-04-12 – 2022-04-14 (×4): 500 mg via INTRAVENOUS
  Filled 2022-04-12 (×5): qty 100

## 2022-04-12 MED ORDER — ACETAMINOPHEN 10 MG/ML IV SOLN
INTRAVENOUS | Status: AC
  Filled 2022-04-12: qty 100

## 2022-04-12 MED ORDER — FENTANYL CITRATE (PF) 100 MCG/2ML IJ SOLN: 25.0000 ug | INTRAMUSCULAR | Status: DC | PRN

## 2022-04-12 MED ORDER — STERILE WATER FOR IRRIGATION IR SOLN
Status: DC | PRN
  Administered 2022-04-12: 3000 mL

## 2022-04-12 MED ORDER — ONDANSETRON 4 MG PO TBDP: 4.0000 mg | ORAL_TABLET | Freq: Four times a day (QID) | ORAL | Status: DC | PRN

## 2022-04-12 MED ORDER — BUPIVACAINE-EPINEPHRINE (PF) 0.25% -1:200000 IJ SOLN
INTRAMUSCULAR | Status: AC
  Filled 2022-04-12: qty 30

## 2022-04-12 MED ORDER — MORPHINE SULFATE (PF) 4 MG/ML IV SOLN: 4.0000 mg | INTRAVENOUS | Status: DC | PRN

## 2022-04-12 MED ORDER — INDOCYANINE GREEN 25 MG IV SOLR
2.5000 mg | Freq: Once | INTRAVENOUS | Status: AC
  Administered 2022-04-12: 2.5 mg via INTRAVENOUS
  Filled 2022-04-12: qty 1

## 2022-04-12 MED ORDER — DIPHENHYDRAMINE HCL 25 MG PO CAPS: 25.0000 mg | ORAL_CAPSULE | Freq: Four times a day (QID) | ORAL | Status: DC | PRN

## 2022-04-12 MED ORDER — KETOROLAC TROMETHAMINE 30 MG/ML IJ SOLN
30.0000 mg | Freq: Four times a day (QID) | INTRAMUSCULAR | Status: DC
  Administered 2022-04-12 – 2022-04-14 (×7): 30 mg via INTRAVENOUS
  Filled 2022-04-12 (×7): qty 1

## 2022-04-12 MED ORDER — PROCHLORPERAZINE MALEATE 10 MG PO TABS: 10.0000 mg | ORAL_TABLET | Freq: Four times a day (QID) | ORAL | Status: DC | PRN

## 2022-04-12 MED ORDER — MELATONIN 3 MG PO TABS: 3.0000 mg | ORAL_TABLET | Freq: Every evening | ORAL | Status: DC | PRN

## 2022-04-12 MED ORDER — ACETAMINOPHEN 10 MG/ML IV SOLN
INTRAVENOUS | Status: DC | PRN
  Administered 2022-04-12: 1000 mg via INTRAVENOUS

## 2022-04-12 MED ORDER — DEXMEDETOMIDINE HCL IN NACL 80 MCG/20ML IV SOLN
INTRAVENOUS | Status: AC
  Filled 2022-04-12: qty 20

## 2022-04-12 MED ORDER — DEXMEDETOMIDINE HCL IN NACL 200 MCG/50ML IV SOLN
INTRAVENOUS | Status: DC | PRN
  Administered 2022-04-12: 8 ug via INTRAVENOUS
  Administered 2022-04-12: 4 ug via INTRAVENOUS
  Administered 2022-04-12 (×2): 8 ug via INTRAVENOUS

## 2022-04-12 MED ORDER — LIDOCAINE HCL (CARDIAC) PF 100 MG/5ML IV SOSY
PREFILLED_SYRINGE | INTRAVENOUS | Status: DC | PRN
  Administered 2022-04-12: 100 mg via INTRAVENOUS

## 2022-04-12 MED ORDER — ONDANSETRON HCL 4 MG/2ML IJ SOLN
4.0000 mg | Freq: Once | INTRAMUSCULAR | Status: AC
Start: 2022-04-12 — End: 2022-04-12
  Administered 2022-04-12: 4 mg via INTRAVENOUS
  Filled 2022-04-12: qty 2

## 2022-04-12 MED ORDER — FENTANYL CITRATE (PF) 100 MCG/2ML IJ SOLN
INTRAMUSCULAR | Status: AC
  Filled 2022-04-12: qty 2

## 2022-04-12 MED ORDER — ACETAMINOPHEN 500 MG PO TABS
1000.0000 mg | ORAL_TABLET | Freq: Four times a day (QID) | ORAL | Status: DC
  Administered 2022-04-12 – 2022-04-14 (×6): 1000 mg via ORAL
  Filled 2022-04-12 (×7): qty 2

## 2022-04-12 MED ORDER — FENTANYL CITRATE (PF) 100 MCG/2ML IJ SOLN
INTRAMUSCULAR | Status: DC | PRN
  Administered 2022-04-12 (×2): 50 ug via INTRAVENOUS

## 2022-04-12 MED ORDER — DEXAMETHASONE SODIUM PHOSPHATE 10 MG/ML IJ SOLN
INTRAMUSCULAR | Status: DC | PRN
  Administered 2022-04-12: 10 mg via INTRAVENOUS

## 2022-04-12 MED ORDER — SODIUM CHLORIDE 0.9 % IV SOLN
2.0000 g | INTRAVENOUS | Status: DC
  Administered 2022-04-12: 2 g via INTRAVENOUS
  Filled 2022-04-12: qty 20

## 2022-04-12 MED ORDER — METHOCARBAMOL 1000 MG/10ML IJ SOLN
500.0000 mg | Freq: Three times a day (TID) | INTRAVENOUS | Status: DC | PRN
  Filled 2022-04-12: qty 5

## 2022-04-12 MED ORDER — BUPIVACAINE-EPINEPHRINE 0.25% -1:200000 IJ SOLN
INTRAMUSCULAR | Status: DC | PRN
  Administered 2022-04-12: 50 mL

## 2022-04-12 MED ORDER — SODIUM CHLORIDE 0.9 % IV SOLN: INTRAVENOUS | Status: DC | PRN

## 2022-04-12 SURGICAL SUPPLY — 55 items
BAG RETRIEVAL 10 (BASKET) ×1
BULB RESERV EVAC DRAIN JP 100C (MISCELLANEOUS) ×1 IMPLANT
CANNULA REDUC XI 12-8 STAPL (CANNULA) ×1
CANNULA REDUCER 12-8 DVNC XI (CANNULA) ×2 IMPLANT
CATH REDDICK CHOLANGI 4FR 50CM (CATHETERS) IMPLANT
CLIP LIGATING HEMO O LOK GREEN (MISCELLANEOUS) ×3 IMPLANT
DERMABOND ADVANCED (GAUZE/BANDAGES/DRESSINGS) ×1
DERMABOND ADVANCED .7 DNX12 (GAUZE/BANDAGES/DRESSINGS) ×2 IMPLANT
DRAIN CHANNEL 19F RND (DRAIN) ×1 IMPLANT
DRAPE ARM DVNC X/XI (DISPOSABLE) ×8 IMPLANT
DRAPE COLUMN DVNC XI (DISPOSABLE) ×2 IMPLANT
DRAPE DA VINCI XI ARM (DISPOSABLE) ×4
DRAPE DA VINCI XI COLUMN (DISPOSABLE) ×1
DRSG TEGADERM 4X4.75 (GAUZE/BANDAGES/DRESSINGS) ×1 IMPLANT
ELECT CAUTERY BLADE 6.4 (BLADE) ×3 IMPLANT
ELECT REM PT RETURN 9FT ADLT (ELECTROSURGICAL) ×3
ELECTRODE REM PT RTRN 9FT ADLT (ELECTROSURGICAL) ×2 IMPLANT
GLOVE BIO SURGEON STRL SZ7 (GLOVE) ×6 IMPLANT
GOWN STRL REUS W/ TWL LRG LVL3 (GOWN DISPOSABLE) ×8 IMPLANT
GOWN STRL REUS W/TWL LRG LVL3 (GOWN DISPOSABLE) ×4
IRRIGATION STRYKERFLOW (MISCELLANEOUS) IMPLANT
IRRIGATOR STRYKERFLOW (MISCELLANEOUS) ×3
IV CATH ANGIO 12GX3 LT BLUE (NEEDLE) IMPLANT
KIT PINK PAD W/HEAD ARE REST (MISCELLANEOUS) ×3 IMPLANT
KIT PINK PAD W/HEAD ARM REST (MISCELLANEOUS) ×2 IMPLANT
LABEL OR SOLS (LABEL) ×3 IMPLANT
MANIFOLD NEPTUNE II (INSTRUMENTS) ×3 IMPLANT
NEEDLE HYPO 22GX1.5 SAFETY (NEEDLE) ×3 IMPLANT
NS IRRIG 500ML POUR BTL (IV SOLUTION) ×3 IMPLANT
OBTURATOR OPTICAL STANDARD 8MM (TROCAR) ×1
OBTURATOR OPTICAL STND 8 DVNC (TROCAR) ×2
OBTURATOR OPTICALSTD 8 DVNC (TROCAR) ×2 IMPLANT
PACK LAP CHOLECYSTECTOMY (MISCELLANEOUS) ×3 IMPLANT
PENCIL ELECTRO HAND CTR (MISCELLANEOUS) ×3 IMPLANT
SEAL CANN UNIV 5-8 DVNC XI (MISCELLANEOUS) ×6 IMPLANT
SEAL XI 5MM-8MM UNIVERSAL (MISCELLANEOUS) ×3
SET TUBE SMOKE EVAC HIGH FLOW (TUBING) ×3 IMPLANT
SOLUTION ELECTROLUBE (MISCELLANEOUS) ×3 IMPLANT
SPIKE FLUID TRANSFER (MISCELLANEOUS) ×3 IMPLANT
SPONGE GAUZE 2X2 8PLY STRL LF (GAUZE/BANDAGES/DRESSINGS) ×1 IMPLANT
SPONGE T-LAP 18X18 ~~LOC~~+RFID (SPONGE) ×3 IMPLANT
SPONGE T-LAP 4X18 ~~LOC~~+RFID (SPONGE) ×1 IMPLANT
STAPLER CANNULA SEAL DVNC XI (STAPLE) ×2 IMPLANT
STAPLER CANNULA SEAL XI (STAPLE) ×1
STOPCOCK 3 WAY MALE LL (IV SETS)
STOPCOCK 3WAY MALE LL (IV SETS) IMPLANT
SUT ETHILON 3-0 FS-10 30 BLK (SUTURE) ×3
SUT MNCRL AB 4-0 PS2 18 (SUTURE) ×3 IMPLANT
SUT VICRYL 0 AB UR-6 (SUTURE) ×6 IMPLANT
SUTURE EHLN 3-0 FS-10 30 BLK (SUTURE) IMPLANT
SYR 20ML LL LF (SYRINGE) ×3 IMPLANT
SYR 30ML LL (SYRINGE) ×3 IMPLANT
SYS BAG RETRIEVAL 10MM (BASKET) ×2
SYSTEM BAG RETRIEVAL 10MM (BASKET) ×2 IMPLANT
WATER STERILE IRR 3000ML UROMA (IV SOLUTION) ×1 IMPLANT

## 2022-04-12 NOTE — ED Notes (Signed)
First Nurse Note: Pt to ED via POV for abdominal pain and vomiting. Pt is in NAD.

## 2022-04-12 NOTE — Anesthesia Preprocedure Evaluation (Addendum)
Anesthesia Evaluation  Patient identified by MRN, date of birth, ID band Patient awake    Reviewed: Allergy & Precautions, H&P , NPO status , Patient's Chart, lab work & pertinent test results, reviewed documented beta blocker date and time   Airway Mallampati: II  TM Distance: >3 FB Neck ROM: full    Dental  (+) Teeth Intact   Pulmonary neg pulmonary ROS,    Pulmonary exam normal        Cardiovascular negative cardio ROS Normal cardiovascular exam Rhythm:regular Rate:Normal     Neuro/Psych negative neurological ROS  negative psych ROS   GI/Hepatic negative GI ROS, Neg liver ROS,   Endo/Other  Morbid obesity  Renal/GU negative Renal ROS  negative genitourinary   Musculoskeletal   Abdominal (+) + obese,   Peds  Hematology negative hematology ROS (+)   Anesthesia Other Findings No past medical history on file. No past surgical history on file. BMI    Body Mass Index: 47.55 kg/m     Reproductive/Obstetrics negative OB ROS                            Anesthesia Physical Anesthesia Plan  ASA: 3  Anesthesia Plan: General ETT   Post-op Pain Management: Ofirmev IV (intra-op)* and Toradol IV (intra-op)*   Induction: Intravenous and Rapid sequence  PONV Risk Score and Plan: 3 and Ondansetron, Dexamethasone, Midazolam and Promethazine  Airway Management Planned: Oral ETT  Additional Equipment:   Intra-op Plan:   Post-operative Plan: Extubation in OR  Informed Consent: I have reviewed the patients History and Physical, chart, labs and discussed the procedure including the risks, benefits and alternatives for the proposed anesthesia with the patient or authorized representative who has indicated his/her understanding and acceptance.     Dental Advisory Given  Plan Discussed with: CRNA  Anesthesia Plan Comments:        Anesthesia Quick Evaluation

## 2022-04-12 NOTE — Anesthesia Procedure Notes (Signed)
Procedure Name: Intubation Date/Time: 04/12/2022 2:52 PM  Performed by: Morene Crocker, CRNAPre-anesthesia Checklist: Patient identified, Patient being monitored, Timeout performed, Emergency Drugs available and Suction available Patient Re-evaluated:Patient Re-evaluated prior to induction Oxygen Delivery Method: Circle system utilized Preoxygenation: Pre-oxygenation with 100% oxygen Induction Type: IV induction, Cricoid Pressure applied and Rapid sequence Laryngoscope Size: 3 and McGraph Grade View: Grade I Tube type: Oral Tube size: 7.0 mm Number of attempts: 1 Airway Equipment and Method: Stylet Placement Confirmation: ETT inserted through vocal cords under direct vision, positive ETCO2 and breath sounds checked- equal and bilateral Secured at: 22 cm Tube secured with: Tape Dental Injury: Teeth and Oropharynx as per pre-operative assessment

## 2022-04-12 NOTE — ED Provider Notes (Signed)
[facility] Provider Note    Event Date/Time   First MD Initiated Contact with Patient 04/12/22 1100     (approximate)   History   Abdominal Pain   HPI  Sierra Ibarra is a 36 y.o. female with no active medical problems who presents with epigastric pain since around 9 PM last night, persistent course, not crampy, and associated with nausea and vomiting.  The patient states that she cooked hamburgers yesterday and the symptoms started a few hours after.  She vomited up most of the food last night.  She has not had any diarrhea; she had 1 bowel movement earlier today which was normal.  She denies any prior history of this pain although does have GERD.  She has no fever or chills, urinary symptoms, or any vaginal bleeding or discharge.      Physical Exam   Triage Vital Signs: ED Triage Vitals  Enc Vitals Group     BP 04/12/22 1021 (!) 159/92     Pulse Rate 04/12/22 1020 62     Resp 04/12/22 1020 18     Temp 04/12/22 1020 98.4 F (36.9 C)     Temp Source 04/12/22 1020 Oral     SpO2 04/12/22 1020 99 %     Weight 04/12/22 1021 260 lb (117.9 kg)     Height 04/12/22 1021 5\' 2"  (1.575 m)     Head Circumference --      Peak Flow --      Pain Score 04/12/22 1021 7     Pain Loc --      Pain Edu? --      Excl. in GC? --     Most recent vital signs: Vitals:   04/12/22 1206 04/12/22 1424  BP: (!) 160/73 (!) 150/69  Pulse: 64 63  Resp: 18 19  Temp: 98 F (36.7 C)   SpO2: 100% 99%     General: Alert and oriented, no distress CV:  Good peripheral perfusion.  Resp:  Normal effort.  Abd:  No distention.  Soft with mild epigastric and RUQ tenderness. Other:  No scleral icterus.  Mucous membranes moist.   ED Results / Procedures / Treatments   Labs (all labs ordered are listed, but only abnormal results are displayed) Labs Reviewed  COMPREHENSIVE METABOLIC PANEL - Abnormal; Notable for the following components:      Result Value    Glucose, Bld 135 (*)    AST 78 (*)    ALT 141 (*)    All other components within normal limits  CBC - Abnormal; Notable for the following components:   WBC 12.0 (*)    Hemoglobin 11.3 (*)    HCT 35.2 (*)    All other components within normal limits  URINALYSIS, ROUTINE W REFLEX MICROSCOPIC - Abnormal; Notable for the following components:   Color, Urine STRAW (*)    APPearance CLEAR (*)    Hgb urine dipstick MODERATE (*)    Bacteria, UA RARE (*)    All other components within normal limits  LIPASE, BLOOD  POC URINE PREG, ED     EKG     RADIOLOGY  06/12/22 abdomen RUQ: I independently viewed and interpreted the images; there are gallstones including what appears to be a large gallstone in the gallbladder neck with no significant pericholecystic fluid or wall thickening.   PROCEDURES:  Critical Care performed: No  Procedures   MEDICATIONS ORDERED IN ED: Medications  cefTRIAXone (ROCEPHIN) 2 g in  sodium chloride 0.9 % 100 mL IVPB ( Intravenous Automatically Held 04/20/22 1400)  0.9 % irrigation (POUR BTL) (500 mLs Irrigation Given 04/12/22 1441)  sodium chloride 0.9 % bolus 1,000 mL (0 mLs Intravenous Stopped 04/12/22 1237)  morphine (PF) 4 MG/ML injection 4 mg (4 mg Intravenous Given 04/12/22 1206)  ondansetron (ZOFRAN) injection 4 mg (4 mg Intravenous Given 04/12/22 1206)  morphine (PF) 4 MG/ML injection 4 mg (4 mg Intravenous Given 04/12/22 1312)  indocyanine green (IC-GREEN) injection 2.5 mg (2.5 mg Intravenous Given 04/12/22 1422)     IMPRESSION / MDM / ASSESSMENT AND PLAN / ED COURSE  I reviewed the triage vital signs and the nursing notes.  36 year old female with no active medical problems presents with epigastric abdominal pain since last night with vomiting.  On exam the patient is overall well-appearing.  She is hypertensive with otherwise normal vital signs.  The abdomen is soft with mild epigastric tenderness.  Differential diagnosis includes, but is not limited to,  gastritis, PUD, gastroenteritis, foodborne illness, pancreatitis, biliary colic, cholecystitis.  Given the lack of distention or sustained vomiting there is no evidence of SBO or other surgical emergency.  Patient's presentation is most consistent with acute presentation with potential threat to life or bodily function.  We will obtain lab work-up, right upper quadrant sonogram, give IV fluids, analgesia, antiemetics and reassess.  ----------------------------------------- 2:58 PM on 04/12/2022 -----------------------------------------  Right upper quadrant ultrasound shows gallstones including in the gallbladder neck.  The WBC count is elevated.  LFTs are also mildly elevated.  Overall presentation is concerning for biliary colic with acute obstruction.  I consulted Dr. Everlene Farrier from general surgery; based on our discussion he agrees to admit the patient.   FINAL CLINICAL IMPRESSION(S) / ED DIAGNOSES   Final diagnoses:  Calculus of gallbladder with biliary obstruction but without cholecystitis     Rx / DC Orders   ED Discharge Orders     None        Note:  This document was prepared using Dragon voice recognition software and may include unintentional dictation errors.    Dionne Bucy, MD 04/12/22 1501

## 2022-04-12 NOTE — Op Note (Signed)
Robotic assisted laparoscopic Cholecystectomy  Pre-operative Diagnosis: Acute cholecystitis  Post-operative Diagnosis: same  Procedure:  Robotic assisted laparoscopic Cholecystectomy  Surgeon: Sterling Big, MD FACS  Anesthesia: Gen. with endotracheal tube  Findings: Severe Acute Cholecystitis with hydrops Hepatic steatosis w Fatty liver Very large liver Super morbid obesity making case challenging  Estimated Blood Loss:20 cc       Specimens: Gallbladder           Complications: none   Procedure Details  The patient was seen again in the Holding Room. The benefits, complications, treatment options, and expected outcomes were discussed with the patient. The risks of bleeding, infection, recurrence of symptoms, failure to resolve symptoms, bile duct damage, bile duct leak, retained common bile duct stone, bowel injury, any of which could require further surgery and/or ERCP, stent, or papillotomy were reviewed with the patient. The likelihood of improving the patient's symptoms with return to their baseline status is good.  The patient and/or family concurred with the proposed plan, giving informed consent.  The patient was taken to Operating Room, identified  and the procedure verified as Laparoscopic Cholecystectomy.  A Time Out was held and the above information confirmed.  Prior to the induction of general anesthesia, antibiotic prophylaxis was administered. VTE prophylaxis was in place. General endotracheal anesthesia was then administered and tolerated well. After the induction, the abdomen was prepped with Chloraprep and draped in the sterile fashion. The patient was positioned in the supine position.  Cut down technique was used to enter the abdominal cavity and a Hasson trochar was placed after two vicryl stitches were anchored to the fascia. Pneumoperitoneum was then created with CO2 and tolerated well without any adverse changes in the patient's vital signs.  Three 8-mm ports  were placed under direct vision. All skin incisions  were infiltrated with a local anesthetic agent before making the incision and placing the trocars.   The patient was positioned  in reverse Trendelenburg, robot was brought to the surgical field and docked in the standard fashion.  We made sure all the instrumentation was kept indirect view at all times and that there were no collision between the arms. I scrubbed out and went to the console.  The gallbladder was identified, severe cholecystis w distended GB, I decompressed the GB in order to be able to grasp it and retract it. Hydrops was aspirated.The fundus grasped and retracted cephalad. Adhesions were lysed bluntly. The infundibulum was grasped and retracted laterally, exposing the peritoneum overlying the triangle of Calot. This was then divided and exposed in a blunt fashion. An extended critical view of the cystic duct and cystic artery was obtained.  The cystic duct was clearly identified and bluntly dissected.   Artery and duct were double clipped and divided. Using ICG cholangiography we visualize the cystic duct and so CBD no evidence of bile injuries. The gallbladder was taken from the gallbladder fossa in a retrograde fashion with the electrocautery.  Hemostasis was achieved with the electrocautery. nspection of the right upper quadrant was performed. No bleeding, bile duct injury or leak, or bowel injury was noted. Robotic instruments and robotic arms were undocked in the standard fashion.  I scrubbed back in. 19 Fr blake drain placed GB fossa The gallbladder was removed and placed in an Endocatch bag.   Pneumoperitoneum was released.  The periumbilical port site was closed with interrumpted 0 Vicryl sutures. 4-0 subcuticular Monocryl was used to close the skin. Dermabond was  applied.  The patient was then  extubated and brought to the recovery room in stable condition. Sponge, lap, and needle counts were correct at closure and at the  conclusion of the case.               Sterling Big, MD, FACS

## 2022-04-12 NOTE — H&P (Signed)
Patient ID: Sierra Ibarra, female   DOB: November 24, 1985, 36 y.o.   MRN: [id number]  HPI Sierra Ibarra is a 36 y.o. female in consultation at the request of Dr. Marisa Severin from the ER, case discussed with him in detail.  Center complaining of acute abdominal pain since last night.  Pain is severe has been present for greater than 48 hours.  Radiates to the right upper quadrant and back.  Pain is sharp.  She also has associated nausea and vomiting.  No fevers no chills no evidence of biliary obstruction.  No jaundice.  He did have hamburgers for supper.  She does have a family history of gallbladder issues. Work-up included an ultrasound that have personally review showing evidence of gallstones with a neck stuck in the gallbladder neck.  Normal common bile duct.  CMP was normal except mild elevation of the AST and ALT.normal alkaline phosphatase and more normal bilirubin CBC shows a white count of 12 with anemia and hemoglobin of 11.3  HPI  PMHx: non significant  She did have history of C-section and tubal ligation    Social History Social History   Tobacco Use   Smoking status: Never   Smokeless tobacco: Never  Vaping Use   Vaping Use: Never used  Substance Use Topics   Alcohol use: Yes    Comment: occasional    Drug use: Yes    Types: Marijuana    Allergies  Allergen Reactions   Amoxicillin    Penicillins     No current facility-administered medications for this encounter.   Current Outpatient Medications  Medication Sig Dispense Refill   guaiFENesin-codeine 100-10 MG/5ML syrup Take 5 mLs by mouth every 6 (six) hours as needed for cough. 120 mL 0   ondansetron (ZOFRAN) 4 MG tablet Take 1 tablet (4 mg total) by mouth daily as needed for nausea or vomiting. 20 tablet 0     Review of Systems Full ROS  was asked and was negative except for the information on the HPI  Physical Exam Blood pressure (!) 160/73, pulse 64, temperature 98 F (36.7 C), temperature source  Oral, resp. rate 18, height 5\' 2"  (1.575 m), weight 117.9 kg, last menstrual period 03/23/2022, SpO2 100 %. CONSTITUTIONAL: NAD, BMI 47. EYES: Pupils are equal, round, t, Sclera are non-icteric. EARS, NOSE, MOUTH AND THROAT:  The oral mucosa is pink and moist. Hearing is intact to voice. LYMPH NODES:  Lymph nodes in the neck are normal. RESPIRATORY:  Lungs are clear. There is normal respiratory effort, with equal breath sounds bilaterally, and without pathologic use of accessory muscles. CARDIOVASCULAR: Heart is regular without murmurs, gallops, or rubs. GI: The abdomen is  soft, the patient right upper quadrant with positive Murphy sign , there is no hepatosplenomegaly. There are normal bowel sounds . GU: Rectal deferred.   MUSCULOSKELETAL: Normal muscle strength and tone. No cyanosis or edema.   SKIN: Turgor is good and there are no pathologic skin lesions or ulcers. NEUROLOGIC: Motor and sensation is grossly normal. Cranial nerves are grossly intact. PSYCH:  Oriented to person, place and time. Affect is normal.  Data Reviewed  I have personally reviewed the patient's imaging, laboratory findings and medical records.    Assessment/Plan 36 year old female with classic signs and symptoms consistent with acute cholecystitis.  Had a discussion with the patient regarding her disease process.  I do recommend prompt cholecystectomy.  She is in agreement. We will start bolus of crystalloids and broad-spectrum antibiotics and post her this  afternoon for robotic cholecystectomy I discussed the procedure in detail.  The patient was given Agricultural engineer.  We discussed the risks and benefits of a laparoscopic cholecystectomy and possible cholangiogram including, but not limited to bleeding, infection, injury to surrounding structures such as the intestine or liver, bile leak, retained gallstones, need to convert to an open procedure, prolonged diarrhea, blood clots such as  DVT, common bile duct  injury, anesthesia risks, and possible need for additional procedures.  The likelihood of improvement in symptoms and return to the patient's normal status is good. We discussed the typical post-operative recovery course.  I Spent 75 minutes in this encounter including coordination of her care, personally reviewing records and imaging studies, placing orders and performing appropriate documentation   Sterling Big, MD FACS General Surgeon 04/12/2022, 1:43 PM

## 2022-04-12 NOTE — Transfer of Care (Signed)
Immediate Anesthesia Transfer of Care Note  Patient: Aracelly Gean Birchwood  Procedure(s) Performed: XI ROBOTIC ASSISTED LAPAROSCOPIC CHOLECYSTECTOMY (Abdomen) INDOCYANINE GREEN FLUORESCENCE IMAGING (ICG)  Patient Location: PACU  Anesthesia Type:General  Level of Consciousness: drowsy  Airway & Oxygen Therapy: Patient Spontanous Breathing and Patient connected to nasal cannula oxygen  Post-op Assessment: Report given to RN and Post -op Vital signs reviewed and stable  Post vital signs: Reviewed and stable  Last Vitals:  Vitals Value Taken Time  BP 121/65 04/12/22 1700  Temp 36.9 C 04/12/22 1655  Pulse 81 04/12/22 1703  Resp 24 04/12/22 1703  SpO2 100 % 04/12/22 1703  Vitals shown include unvalidated device data.  Last Pain:  Vitals:   04/12/22 1655  TempSrc:   PainSc: Asleep         Complications: No notable events documented.

## 2022-04-12 NOTE — ED Triage Notes (Signed)
Pt here with abd pain that is intermittent and vomiting. Pt states pain is centered and radiates to her back. Pt states she normally has trapped gas and she is able to relive it herself but this time she in unable to and the pain is getting worse.

## 2022-04-13 DIAGNOSIS — K760 Fatty (change of) liver, not elsewhere classified: Secondary | ICD-10-CM | POA: Diagnosis present

## 2022-04-13 DIAGNOSIS — K810 Acute cholecystitis: Secondary | ICD-10-CM | POA: Diagnosis present

## 2022-04-13 DIAGNOSIS — K821 Hydrops of gallbladder: Secondary | ICD-10-CM | POA: Diagnosis present

## 2022-04-13 DIAGNOSIS — R109 Unspecified abdominal pain: Secondary | ICD-10-CM | POA: Diagnosis present

## 2022-04-13 DIAGNOSIS — Z98891 History of uterine scar from previous surgery: Secondary | ICD-10-CM | POA: Diagnosis not present

## 2022-04-13 DIAGNOSIS — Z6841 Body Mass Index (BMI) 40.0 and over, adult: Secondary | ICD-10-CM | POA: Diagnosis not present

## 2022-04-13 DIAGNOSIS — R160 Hepatomegaly, not elsewhere classified: Secondary | ICD-10-CM | POA: Diagnosis present

## 2022-04-13 LAB — CBC
HCT: 32.7 % — ABNORMAL LOW (ref 36.0–46.0)
Hemoglobin: 10.1 g/dL — ABNORMAL LOW (ref 12.0–15.0)
MCH: 25.2 pg — ABNORMAL LOW (ref 26.0–34.0)
MCHC: 30.9 g/dL (ref 30.0–36.0)
MCV: 81.5 fL (ref 80.0–100.0)
Platelets: 219 10*3/uL (ref 150–400)
RBC: 4.01 MIL/uL (ref 3.87–5.11)
RDW: 14.5 % (ref 11.5–15.5)
WBC: 14.5 10*3/uL — ABNORMAL HIGH (ref 4.0–10.5)
nRBC: 0 % (ref 0.0–0.2)

## 2022-04-13 LAB — PHOSPHORUS: Phosphorus: 3.7 mg/dL (ref 2.5–4.6)

## 2022-04-13 LAB — MAGNESIUM: Magnesium: 2 mg/dL (ref 1.7–2.4)

## 2022-04-13 LAB — COMPREHENSIVE METABOLIC PANEL
ALT: 105 U/L — ABNORMAL HIGH (ref 0–44)
AST: 83 U/L — ABNORMAL HIGH (ref 15–41)
Albumin: 3.2 g/dL — ABNORMAL LOW (ref 3.5–5.0)
Alkaline Phosphatase: 69 U/L (ref 38–126)
Anion gap: 6 (ref 5–15)
BUN: 7 mg/dL (ref 6–20)
CO2: 27 mmol/L (ref 22–32)
Calcium: 8 mg/dL — ABNORMAL LOW (ref 8.9–10.3)
Chloride: 104 mmol/L (ref 98–111)
Creatinine, Ser: 0.43 mg/dL — ABNORMAL LOW (ref 0.44–1.00)
GFR, Estimated: >60 mL/min (ref >60)
Glucose, Bld: 127 mg/dL — ABNORMAL HIGH (ref 70–99)
Potassium: 3.5 mmol/L (ref 3.5–5.1)
Sodium: 137 mmol/L (ref 135–145)
Total Bilirubin: 0.4 mg/dL (ref 0.3–1.2)
Total Protein: 6.7 g/dL (ref 6.5–8.1)

## 2022-04-13 LAB — HIV ANTIBODY (ROUTINE TESTING W REFLEX): HIV Screen 4th Generation wRfx: NONREACTIVE

## 2022-04-13 MED ORDER — SIMETHICONE 80 MG PO CHEW
80.0000 mg | CHEWABLE_TABLET | Freq: Four times a day (QID) | ORAL | Status: DC | PRN
  Administered 2022-04-14: 80 mg via ORAL
  Filled 2022-04-13: qty 1

## 2022-04-13 NOTE — Progress Notes (Signed)
POD # 1 Still having some pain and mobility issues Taking some po but decrease appetite  PE NAD Abd: soft, drain serous output, incisions c/d/I  A/P Doing ok but severe cholecystitis, still requires inpt for antibiotics and parenteral pain control. Needs to mobilize more and able to take regular diet before DC No quite there yet D/w pt about op findings

## 2022-04-13 NOTE — Anesthesia Postprocedure Evaluation (Signed)
Anesthesia Post Note  Patient: Sierra Ibarra  Procedure(s) Performed: XI ROBOTIC ASSISTED LAPAROSCOPIC CHOLECYSTECTOMY (Abdomen) INDOCYANINE GREEN FLUORESCENCE IMAGING (ICG)  Patient location during evaluation: PACU Anesthesia Type: General Level of consciousness: awake and alert Pain management: pain level controlled Vital Signs Assessment: post-procedure vital signs reviewed and stable Respiratory status: spontaneous breathing, nonlabored ventilation and respiratory function stable Cardiovascular status: blood pressure returned to baseline and stable Postop Assessment: no apparent nausea or vomiting Anesthetic complications: no   No notable events documented.   Last Vitals:  Vitals:   04/13/22 1209 04/13/22 1634  BP: 116/61 (!) 119/55  Pulse: 81 80  Resp: 18 20  Temp: 36.7 C 36.7 C  SpO2: 96% 100%    Last Pain:  Vitals:   04/13/22 1634  TempSrc: Oral  PainSc:                  Foye Deer

## 2022-04-14 MED ORDER — HYDROCODONE-ACETAMINOPHEN 5-325 MG PO TABS: 1.0000 | ORAL_TABLET | ORAL | 0 refills | Status: DC | PRN

## 2022-04-14 MED ORDER — CEFUROXIME AXETIL 500 MG PO TABS: 500.0000 mg | ORAL_TABLET | Freq: Two times a day (BID) | ORAL | 0 refills | Status: DC

## 2022-04-14 NOTE — Discharge Instructions (Signed)

## 2022-04-14 NOTE — Progress Notes (Signed)
Patient educated on discharge instructions, medications, and follow up appointments. Patient verbalized understanding. Will be escorted out by staff.

## 2022-04-14 NOTE — Progress Notes (Signed)
Right JP Drain and sutures removed. Patient tolerated well.  Gauze and tegaderm placed over site.

## 2022-04-14 NOTE — Discharge Summary (Signed)
Patient ID: Sierra Ibarra MRN: [id number] DOB/AGE: 03/25/1986 36 y.o.  Admit date: 04/12/2022 Discharge date: 04/14/2022   Discharge Diagnoses:  Principal Problem:   Acute cholecystitis   Procedures: Robotic cholecystectomy  Hospital Course:   admitted with findings consistent with acute cholecystitis and  was taken promptly to the operating room for an uneventful robotic laparoscopic cholecystectomy.  Patient was kept for 2 nights due to severity of cholecystitis and poor IV antibiotics. At The time of discharge the patient was ambulating,  pain was controlled.  Her vital signs were stable and she was afebrile.   physical exam at discharge showed a pt  in no acute distress.  Awake and alert.  Abdomen: Soft incisions healing well without infection or peritonitis.  Extremities well-perfused and no edema.  JP drain with serous drainage.  We did remove the drain before discharge condition of the patient the time of discharge was stable   Disposition: Discharge disposition: 01-Home or Self Care       Discharge Instructions     (HEART FAILURE PATIENTS) Call MD:  Anytime you have any of the following symptoms: 1) 3 pound weight gain in 24 hours or 5 pounds in 1 week 2) shortness of breath, with or without a dry hacking cough 3) swelling in the hands, feet or stomach 4) if you have to sleep on extra pillows at night in order to breathe.   Complete by: As directed    Call MD for:  difficulty breathing, headache or visual disturbances   Complete by: As directed    Call MD for:  extreme fatigue   Complete by: As directed    Call MD for:  hives   Complete by: As directed    Call MD for:  persistant dizziness or light-headedness   Complete by: As directed    Call MD for:  persistant nausea and vomiting   Complete by: As directed    Call MD for:  redness, tenderness, or signs of infection (pain, swelling, redness, odor or green/yellow discharge around incision site)   Complete by: As  directed    Call MD for:  severe uncontrolled pain   Complete by: As directed    Call MD for:  temperature >100.4   Complete by: As directed    Diet - low sodium heart healthy   Complete by: As directed    Discharge instructions   Complete by: As directed    Shower daily starting today, May use ibuprofen OTC  in addition to current meds for mild pain 800gm q 8 hrs prn)   Increase activity slowly   Complete by: As directed    Lifting restrictions   Complete by: As directed    20 lbs x 6 weeks   Remove dressing in 24 hours   Complete by: As directed       Allergies as of 04/14/2022       Reactions   Amoxicillin    Penicillins         Medication List     TAKE these medications    cefUROXime 500 MG tablet Commonly known as: CEFTIN Take 1 tablet (500 mg total) by mouth 2 (two) times daily with a meal.   guaiFENesin-codeine 100-10 MG/5ML syrup Take 5 mLs by mouth every 6 (six) hours as needed for cough.   HYDROcodone-acetaminophen 5-325 MG tablet Commonly known as: NORCO/VICODIN Take 1-2 tablets by mouth every 4 (four) hours as needed for moderate pain.   ondansetron 4 MG tablet  Commonly known as: Zofran Take 1 tablet (4 mg total) by mouth daily as needed for nausea or vomiting.        Follow-up Information     Tylene Fantasia, PA-C Follow up on 04/23/2022.   Specialty: Physician Assistant Contact information: [address] [place] [address] [phone]                  Caroleen Hamman, MD FACS

## 2022-04-19 LAB — SURGICAL PATHOLOGY

## 2022-05-02 ENCOUNTER — Encounter: Payer: Self-pay | Admitting: Physician Assistant

## 2022-05-02 ENCOUNTER — Ambulatory Visit (INDEPENDENT_AMBULATORY_CARE_PROVIDER_SITE_OTHER): Payer: Medicaid Other | Admitting: Physician Assistant

## 2022-05-02 VITALS — BP 150/82 | HR 88 | Temp 98.5°F | Ht 62.0 in | Wt 277.4 lb

## 2022-05-02 DIAGNOSIS — Z09 Encounter for follow-up examination after completed treatment for conditions other than malignant neoplasm: Secondary | ICD-10-CM

## 2022-05-02 DIAGNOSIS — K810 Acute cholecystitis: Secondary | ICD-10-CM

## 2022-05-02 DIAGNOSIS — K8000 Calculus of gallbladder with acute cholecystitis without obstruction: Secondary | ICD-10-CM

## 2022-05-02 NOTE — Patient Instructions (Signed)
GENERAL POST-OPERATIVE PATIENT INSTRUCTIONS   WOUND CARE INSTRUCTIONS:  Keep a dry clean dressing on the wound if there is drainage. The initial bandage may be removed after 24 hours.  Once the wound has quit draining you may leave it open to air.  If clothing rubs against the wound or causes irritation and the wound is not draining you may cover it with a dry dressing during the daytime.  Try to keep the wound dry and avoid ointments on the wound unless directed to do so.  If the wound becomes bright red and painful or starts to drain infected material that is not clear, please contact your physician immediately.  If the wound is mildly pink and has a thick firm ridge underneath it, this is normal, and is referred to as a healing ridge.  This will resolve over the next 4-6 weeks.  BATHING: You may shower if you have been informed of this by your surgeon. However, Please do not submerge in a tub, hot tub, or pool until incisions are completely sealed or have been told by your surgeon that you may do so.  DIET:  You may eat any foods that you can tolerate.  It is a good idea to eat a high fiber diet and take in plenty of fluids to prevent constipation.  If you do become constipated you may want to take a mild laxative or take ducolax tablets on a daily basis until your bowel habits are regular.  Constipation can be very uncomfortable, along with straining, after recent surgery.  ACTIVITY:  You are encouraged to cough and deep breath or use your incentive spirometer if you were given one, every 15-30 minutes when awake.  This will help prevent respiratory complications and low grade fevers post-operatively if you had a general anesthetic.  You may want to hug a pillow when coughing and sneezing to add additional support to the surgical area, if you had abdominal or chest surgery, which will decrease pain during these times.  You are encouraged to walk and engage in light activity for the next two weeks.  You  should not lift more than 20 pounds for 6 weeks total after surgery as it could put you at increased risk for complications.  Twenty pounds is roughly equivalent to a plastic bag of groceries. At that time- Listen to your body when lifting, if you have pain when lifting, stop and then try again in a few days. Soreness after doing exercises or activities of daily living is normal as you get back in to your normal routine.  MEDICATIONS:  Try to take narcotic medications and anti-inflammatory medications, such as tylenol, ibuprofen, naprosyn, etc., with food.  This will minimize stomach upset from the medication.  Should you develop nausea and vomiting from the pain medication, or develop a rash, please discontinue the medication and contact your physician.  You should not drive, make important decisions, or operate machinery when taking narcotic pain medication.  SUNBLOCK Use sun block to incision area over the next year if this area will be exposed to sun. This helps decrease scarring and will allow you avoid a permanent darkened area over your incision.  QUESTIONS:  Please feel free to call our office if you have any questions, and we will be glad to assist you. [phone]   Gallbladder Eating Plan  High blood cholesterol, obesity, a sedentary lifestyle, an unhealthy diet, and diabetes are risk factors for developing gallstones. If you have a gallbladder condition, you   may have trouble digesting fats and tolerating high fat intake. Eating a low-fat diet can help reduce your symptoms and may be helpful before and after having surgery to remove your gallbladder (cholecystectomy). Your health care provider may recommend that you work with a dietitian to help you reduce the amount of fat in your diet. What are tips for following this plan? General guidelines Limit your fat intake to less than 30% of your total daily calories. If you eat around 1,800 calories each day, this means eating less than 60  grams (g) of fat per day. Fat is an important part of a healthy diet. Eating a low-fat diet can make it hard to maintain a healthy body weight. Ask your dietitian how much fat, calories, and other nutrients you need each day. Eat small, frequent meals throughout the day instead of three large meals. Drink at least 8-10 cups (1.9-2.4 L) of fluid a day. Drink enough fluid to keep your urine pale yellow. If you drink alcohol: Limit how much you have to: 0-1 drink a day for women who are not pregnant. 0-2 drinks a day for men. Know how much alcohol is in a drink. In the [place] one drink equals one 12 oz bottle of beer (355 mL), one 5 oz glass of wine (148 mL), or one 1 oz glass of hard liquor (44 mL). Reading food labels  Check nutrition facts on food labels for the amount of fat per serving. Choose foods with less than 3 grams of fat per serving. Shopping Choose nonfat and low-fat healthy foods. Look for the words "nonfat," "low-fat," or "fat-free." Avoid buying processed or prepackaged foods. Cooking Cook using low-fat methods, such as baking, broiling, grilling, or boiling. Cook with small amounts of healthy fats, such as olive oil, grapeseed oil, canola oil, avocado oil, or sunflower oil. What foods are recommended?  All fresh, frozen, or canned fruits and vegetables. Whole grains. Low-fat or nonfat (skim) milk and yogurt. Lean meat, skinless poultry, fish, eggs, and beans. Low-fat protein supplement powders or drinks. Spices and herbs. The items listed above may not be a complete list of foods and beverages you can eat and drink. Contact a dietitian for more information. What foods are not recommended? High-fat foods. These include baked goods, fast food, fatty cuts of meat, ice cream, french toast, sweet rolls, pizza, cheese bread, foods covered with butter, creamy sauces, or cheese. Fried foods. These include french fries, tempura, battered fish, breaded chicken, fried breads, and  sweets. Foods that cause bloating and gas. The items listed above may not be a complete list of foods that you should avoid. Contact a dietitian for more information. Summary A low-fat diet can be helpful if you have a gallbladder condition, or before and after gallbladder surgery. Limit your fat intake to less than 30% of your total daily calories. This is about 60 g of fat if you eat 1,800 calories each day. Eat small, frequent meals throughout the day instead of three large meals. This information is not intended to replace advice given to you by your health care provider. Make sure you discuss any questions you have with your health care provider. Document Revised: 10/05/2021 Document Reviewed: 10/05/2021 Elsevier Patient Education  2023 Elsevier Inc.    

## 2022-05-02 NOTE — Progress Notes (Signed)
[facility] POST-OP OFFICE VISIT  05/02/2022  HPI: Sierra Ibarra is a 36 y.o. female 20 days s/p robotic assisted laparoscopic cholecystectomy for acute cholecystitis with Dr Everlene Farrier.   She is overall doing well Umbilicus is still sore; particularly with certain movements Diarrhea remains an issue. She gets some relief with a blander diet and has been trying to eat non-fatty foods however even yogurt "can send there to the bathroom right away." No fever, chills, nausea, emesis Incisions are healing well No other complaints   Vital signs: BP (!) 150/82   Pulse 88   Temp 98.5 F (36.9 C) (Oral)   Ht 5\' 2"  (1.575 m)   Wt 277 lb 6.4 oz (125.8 kg)   LMP 03/23/2022 (Exact Date)   SpO2 98%   BMI 50.74 kg/m    Physical Exam: Constitutional: Well appearing female, NAD Abdomen: Soft, non-tender, non-distended, no rebound/guarding Skin: Laparoscopic incisions are healing well, no erythema or drainage   Assessment/Plan: This is a 36 y.o. female 20 days s/p robotic assisted laparoscopic cholecystectomy for acute cholecystitis   - Pain control prn  - reviewed etiology of diarrhea after cholecystectomy; encouraged her to continue to follow dietary recommendations. Trial imodium BID  - Reviewed wound care recommendation  - Reviewed lifting restrictions; 4 weeks total (1 more week)  - Reviewed surgical pathology; Acute on chronic cholecystitis   - She can follow up on as needed basis; She understands to call with questions/concerns  -- 20, PA-C [facility] 05/02/2022, 4:20 PM M-F: 7am - 4pm

## 2022-09-19 ENCOUNTER — Observation Stay
Admission: EM | Admit: 2022-09-19 | Discharge: 2022-09-20 | Disposition: A | Discharge status: Home / Self Care | Payer: Medicaid Other | Attending: Obstetrics | Admitting: Obstetrics

## 2022-09-19 ENCOUNTER — Other Ambulatory Visit: Payer: Self-pay

## 2022-09-19 ENCOUNTER — Emergency Department: Payer: Medicaid Other

## 2022-09-19 DIAGNOSIS — Z79899 Other long term (current) drug therapy: Secondary | ICD-10-CM | POA: Diagnosis not present

## 2022-09-19 DIAGNOSIS — N920 Excessive and frequent menstruation with regular cycle: Principal | ICD-10-CM | POA: Insufficient documentation

## 2022-09-19 DIAGNOSIS — D62 Acute posthemorrhagic anemia: Secondary | ICD-10-CM | POA: Insufficient documentation

## 2022-09-19 DIAGNOSIS — D649 Anemia, unspecified: Secondary | ICD-10-CM

## 2022-09-19 DIAGNOSIS — N83291 Other ovarian cyst, right side: Secondary | ICD-10-CM | POA: Insufficient documentation

## 2022-09-19 LAB — CBC
HCT: 19.9 % — ABNORMAL LOW (ref 36.0–46.0)
Hemoglobin: 6.3 g/dL — ABNORMAL LOW (ref 12.0–15.0)
MCH: 25.1 pg — ABNORMAL LOW (ref 26.0–34.0)
MCHC: 31.7 g/dL (ref 30.0–36.0)
MCV: 79.3 fL — ABNORMAL LOW (ref 80.0–100.0)
Platelets: 220 10*3/uL (ref 150–400)
RBC: 2.51 MIL/uL — ABNORMAL LOW (ref 3.87–5.11)
RDW: 14.2 % (ref 11.5–15.5)
WBC: 8 10*3/uL (ref 4.0–10.5)
nRBC: 0.4 % — ABNORMAL HIGH (ref 0.0–0.2)

## 2022-09-19 LAB — COMPREHENSIVE METABOLIC PANEL
ALT: 13 U/L (ref 0–44)
AST: 18 U/L (ref 15–41)
Albumin: 3.2 g/dL — ABNORMAL LOW (ref 3.5–5.0)
Alkaline Phosphatase: 51 U/L (ref 38–126)
Anion gap: 5 (ref 5–15)
BUN: 9 mg/dL (ref 6–20)
CO2: 28 mmol/L (ref 22–32)
Calcium: 8 mg/dL — ABNORMAL LOW (ref 8.9–10.3)
Chloride: 105 mmol/L (ref 98–111)
Creatinine, Ser: 0.57 mg/dL (ref 0.44–1.00)
GFR, Estimated: >60 mL/min (ref >60)
Glucose, Bld: 134 mg/dL — ABNORMAL HIGH (ref 70–99)
Potassium: 3.3 mmol/L — ABNORMAL LOW (ref 3.5–5.1)
Sodium: 138 mmol/L (ref 135–145)
Total Bilirubin: 0.5 mg/dL (ref 0.3–1.2)
Total Protein: 6.4 g/dL — ABNORMAL LOW (ref 6.5–8.1)

## 2022-09-19 LAB — HCG, QUANTITATIVE, PREGNANCY: hCG, Beta Chain, Quant, S: <1 m[IU]/mL (ref <5)

## 2022-09-19 LAB — PREPARE RBC (CROSSMATCH)

## 2022-09-19 LAB — ABO/RH: ABO/RH(D): O POS

## 2022-09-19 MED ORDER — SODIUM CHLORIDE 0.9 % IV SOLN: 10.0000 mL/h | Freq: Once | INTRAVENOUS | Status: DC

## 2022-09-19 MED ORDER — TRANEXAMIC ACID-NACL 1000-0.7 MG/100ML-% IV SOLN
1000.0000 mg | Freq: Once | INTRAVENOUS | Status: AC
  Administered 2022-09-19: 1000 mg via INTRAVENOUS
  Filled 2022-09-19: qty 100

## 2022-09-19 MED ORDER — ZOLPIDEM TARTRATE 5 MG PO TABS: 5.0000 mg | ORAL_TABLET | Freq: Every evening | ORAL | Status: DC | PRN

## 2022-09-19 MED ORDER — MEDROXYPROGESTERONE ACETATE 10 MG PO TABS
20.0000 mg | ORAL_TABLET | Freq: Three times a day (TID) | ORAL | Status: DC
  Administered 2022-09-19 – 2022-09-20 (×2): 20 mg via ORAL
  Filled 2022-09-19 (×2): qty 2

## 2022-09-19 MED ORDER — ONDANSETRON HCL 4 MG/2ML IJ SOLN: 4.0000 mg | Freq: Four times a day (QID) | INTRAMUSCULAR | Status: DC | PRN

## 2022-09-19 MED ORDER — PRENATAL MULTIVITAMIN CH: 1.0000 | ORAL_TABLET | Freq: Every day | ORAL | Status: DC

## 2022-09-19 MED ORDER — SODIUM CHLORIDE 0.9 % IV SOLN: INTRAVENOUS | Status: DC

## 2022-09-19 MED ORDER — ONDANSETRON HCL 4 MG/2ML IJ SOLN
4.0000 mg | Freq: Once | INTRAMUSCULAR | Status: AC
  Administered 2022-09-19: 4 mg via INTRAVENOUS
  Filled 2022-09-19: qty 2

## 2022-09-19 MED ORDER — ACETAMINOPHEN 500 MG PO TABS
1000.0000 mg | ORAL_TABLET | Freq: Four times a day (QID) | ORAL | Status: DC | PRN
  Administered 2022-09-20: 1000 mg via ORAL
  Filled 2022-09-19: qty 2

## 2022-09-19 MED ORDER — OXYCODONE-ACETAMINOPHEN 5-325 MG PO TABS: 1.0000 | ORAL_TABLET | ORAL | Status: DC | PRN

## 2022-09-19 MED ORDER — ONDANSETRON HCL 4 MG PO TABS: 4.0000 mg | ORAL_TABLET | Freq: Three times a day (TID) | ORAL | Status: DC | PRN

## 2022-09-19 NOTE — ED Notes (Signed)
Nurse Midwife at the bedside.

## 2022-09-19 NOTE — ED Triage Notes (Signed)
Pt reports heavy menstrual bleeding since Sunday. Pt reports at onset on Sunday blood was bright red with quarter sized clots. Pt reports while bleeding has been heavy since Sunday but that it has increased yesterday. Pt is now filling both a super tampon and pad Q45 min with passage of clots the size of her hand. Pt reports every time she sits on the toilet she is also gushing blood. Pt is not on any form of birth control. Pt does not have either fallopian tubes. Pt was seen by GYN last week and is awaiting results. GYN referred pt here today.   Pt reports fatigue, dizziness, and SOB today. Pt is currently alert and oriented. Breathing unlabored speaking in full sentences. Denies chest pain or pressure. Pt reports hx of anemia.

## 2022-09-19 NOTE — ED Notes (Signed)
Pt to US.

## 2022-09-19 NOTE — ED Provider Notes (Signed)
[facility] Provider Note   Event Date/Time   First MD Initiated Contact with Patient 09/19/22 2008     (approximate) History  Vaginal Bleeding  HPI Sierra Ibarra is a 36 y.o. female with a past medical history of bilateral tubal ligation and morbid obesity who presents for heavy menstrual bleeding since Sunday.  Patient reports onset of bright red blood and quarter sized clots at that time.  Patient states that she has had heavy bleeding and clotting since that time including feeling a super tampon and a pad every 45 minutes over the past 2 days.  Patient reports every time she sits on the toilet she is also having a gush of blood.  Patient is not on any form of birth control nor is she sexually active.  Patient states that she was seen by OB/GYN recently and is awaiting results however when she called her clinic today they told her to present to the emergency department.  Patient also endorses fatigue, dizziness, shortness of breath.  Patient denies any history of bleeding/clotting disorders or anemia ROS: Patient currently denies any vision changes, tinnitus, difficulty speaking, facial droop, sore throat, chest pain, shortness of breath, abdominal pain, nausea/vomiting/diarrhea, dysuria, or weakness/numbness/paresthesias in any extremity   Physical Exam  Triage Vital Signs: ED Triage Vitals  Enc Vitals Group     BP 09/19/22 1914 (!) 158/78     Pulse Rate 09/19/22 1914 (!) 108     Resp 09/19/22 1914 20     Temp 09/19/22 1914 99.2 F (37.3 C)     Temp Source 09/19/22 1914 Oral     SpO2 09/19/22 1914 99 %     Weight 09/19/22 1914 275 lb (124.7 kg)     Height 09/19/22 1914 5\' 2"  (1.575 m)     Head Circumference --      Peak Flow --      Pain Score 09/19/22 1925 0     Pain Loc --      Pain Edu? --      Excl. in GC? --    Most recent vital signs: Vitals:   09/19/22 1914  BP: (!) 158/78  Pulse: (!) 108  Resp: 20  Temp: 99.2 F (37.3 C)  SpO2: 99%    General: Awake, oriented x4. CV:  Good peripheral perfusion.  Resp:  Normal effort.  Abd:  No distention.  Pelvic:  No external rashes or lesions.  Dark red blood pooling in vaginal vault that is oozing from the cervical which is closed Other:  Morbidly obese middle-aged Caucasian female laying in bed in no acute distress.  Conjunctival pallor ED Results / Procedures / Treatments  Labs (all labs ordered are listed, but only abnormal results are displayed) Labs Reviewed  CBC - Abnormal; Notable for the following components:      Result Value   RBC 2.51 (*)    Hemoglobin 6.3 (*)    HCT 19.9 (*)    MCV 79.3 (*)    MCH 25.1 (*)    nRBC 0.4 (*)    All other components within normal limits  COMPREHENSIVE METABOLIC PANEL - Abnormal; Notable for the following components:   Potassium 3.3 (*)    Glucose, Bld 134 (*)    Calcium 8.0 (*)    Total Protein 6.4 (*)    Albumin 3.2 (*)    All other components within normal limits  HCG, QUANTITATIVE, PREGNANCY  URINALYSIS, ROUTINE W REFLEX MICROSCOPIC  POC URINE PREG, ED  TYPE AND SCREEN  PREPARE RBC (CROSSMATCH)  ABO/RH   EKG ED ECG REPORT I, Merwyn Katos, the attending physician, personally viewed and interpreted this ECG. Date: 09/19/2022 EKG Time: 1936 Rate: 97 Rhythm: normal sinus rhythm QRS Axis: normal Intervals: normal ST/T Wave abnormalities: normal Narrative Interpretation: no evidence of acute ischemia PROCEDURES: Critical Care performed: Yes, see critical care procedure note(s) .1-3 Lead EKG Interpretation  Performed by: Merwyn Katos, MD Authorized by: Merwyn Katos, MD     Interpretation: abnormal     ECG rate:  107   ECG rate assessment: tachycardic     Rhythm: sinus tachycardia     Ectopy: none     Conduction: normal   CRITICAL CARE Performed by: Merwyn Katos  Total critical care time: 31 minutes  Critical care time was exclusive of separately billable procedures and treating other  patients.  Critical care was necessary to treat or prevent imminent or life-threatening deterioration.  Critical care was time spent personally by me on the following activities: development of treatment plan with patient and/or surrogate as well as nursing, discussions with consultants, evaluation of patient's response to treatment, examination of patient, obtaining history from patient or surrogate, ordering and performing treatments and interventions, ordering and review of laboratory studies, ordering and review of radiographic studies, pulse oximetry and re-evaluation of patient's condition.  MEDICATIONS ORDERED IN ED: Medications  0.9 %  sodium chloride infusion (0 mL/hr Intravenous Hold 09/19/22 2114)  ondansetron (ZOFRAN) injection 4 mg (4 mg Intravenous Given 09/19/22 1947)   IMPRESSION / MDM / ASSESSMENT AND PLAN / ED COURSE  I reviewed the triage vital signs and the nursing notes.                             The patient is on the cardiac monitor to evaluate for evidence of arrhythmia and/or significant heart rate changes. Patient's presentation is most consistent with acute presentation with potential threat to life or bodily function.  This patient presents to the ED for concern of heavy vaginal bleeding as well as fatigue, dizziness, and shortness of breath with exertion, this involves an extensive number of treatment options, and is a complaint that carries with it a high risk of complications and morbidity.  The differential diagnosis includes symptomatic anemia, DIC, new onset heart failure   Co morbidities that complicate the patient evaluation    Morbid obesity  Additional history obtained:    External records from outside source obtained and reviewed including an office visit with Dr. Dalbert Garnet and OB/GYN on 09/12/2022   Lab Tests:    I Ordered, and personally interpreted labs.  The pertinent results include: Hemoglobin of 6.3, beta hCG less than 1   Cardiac  Monitoring: / EKG:    The patient was maintained on a cardiac monitor.  I personally viewed and interpreted the cardiac monitored which showed an underlying rhythm of: Normal sinus rhythm  Consultations Obtained:    I requested consultation with the [facility],  and discussed lab and imaging findings as well as pertinent plan - they recommend: Admission for continued blood transfusions as needed   Problem List / ED Course / Critical interventions / Medication management    Symptomatic anemia, menorrhagia  I ordered medication including 1 unit PRBC for anemia  Reevaluation of the patient after these medicines showed that the patient improved  I have reviewed the patients home medicines and have made adjustments  as needed  Dispo: Admit to OB/GYN     FINAL CLINICAL IMPRESSION(S) / ED DIAGNOSES   Final diagnoses:  Menorrhagia with regular cycle  Symptomatic anemia   Rx / DC Orders   ED Discharge Orders     None      Note:  This document was prepared using Dragon voice recognition software and may include unintentional dictation errors.   Merwyn Katos, MD 09/19/22 2136

## 2022-09-19 NOTE — ED Notes (Addendum)
Patients consent obtained via paper consent form and placed in the patients chart.

## 2022-09-19 NOTE — Consult Note (Signed)
Consult History and Physical   SERVICE: Gynecology   Patient Name: Sierra Ibarra Patient MRN:   [id number]  CC: heavy menstrual bleeding  HPI: Sierra Ibarra is a 36 y.o. [id number] with menorrhagia.  Sierra Ibarra states her menstrual cycle started on 09/15/2022 and has been heavy since.  She's been bleeding through a super tampon and pad every hour and sometimes has to change it twice an hour.  She started to feel light headed and dizzy today and presented to the ED for evaluation.  Sierra Ibarra reports her menstrual cycles are typically monthly, every 28-30 days, lasts 3-4 days, and are typically light to moderate.  Her cycle that started on 08/13/2022 was her first heavy menstrual cycle.  She was seen by Haroldine Laws, CNM on 09/12/2022 and had labs done for AUB and an [place] was scheduled for follow up visit.  She denies any significant changes in her health, medications, or weight in the past month.  She reports a BTL that was done after her 2nd child was born.  She was not on any hormonal contraception for her menstrual cycle.     Review of Systems: positives in bold Review of Systems  Constitutional:  Negative for fever, malaise/fatigue and weight loss.  Eyes:  Negative for blurred vision.  Respiratory:  Negative for cough, shortness of breath and wheezing.   Cardiovascular:  Negative for chest pain.  Gastrointestinal:  Negative for abdominal pain, nausea and vomiting.  Genitourinary:  Negative for dysuria, frequency and urgency.       Heavy menstrual cycle with large clots   Musculoskeletal:  Negative for back pain and myalgias.  Neurological:  Positive for dizziness. Negative for headaches.  Psychiatric/Behavioral:  Negative for depression. The patient is not nervous/anxious.      Past Obstetrical History: OB History     Gravida  2   Para  2   Term  2   Preterm      AB      Living  2      SAB      IAB      Ectopic      Multiple      Live Births  2            Past Gynecologic History: Patient's last menstrual period was 09/15/2022. Menstrual frequency Q 28-30 days lasting 3-4 days, overall light to moderate.  Last 2 menstrual cycles were heavier requiring 1-2 pads/hour,  and soaking through pads at night.   Past Medical History: History reviewed. No pertinent past medical history.  Past Surgical History:   Past Surgical History:  Procedure Laterality Date   CESAREAN SECTION     CHOLECYSTECTOMY      Family History: family history reviewed, non-contributory   Social History:  reports that she has never smoked. She has never used smokeless tobacco. She reports current alcohol use. She reports current drug use. Drug: Marijuana.    Home Medications:  Medications reconciled in EPIC  No current facility-administered medications on file prior to encounter.   Current Outpatient Medications on File Prior to Encounter  Medication Sig Dispense Refill   Multiple Vitamin (MULTI-VITAMIN) tablet Take 1 tablet by mouth daily.     venlafaxine XR (EFFEXOR-XR) 75 MG 24 hr capsule Take by mouth.      Allergies:  Allergies  Allergen Reactions   Amoxicillin    Penicillins     Physical Exam:  Temp:  [98.8 F (37.1 C)-99.2 F (37.3 C)] 98.8 F (37.1 C) (  11/16 2140) Pulse Rate:  [88-108] 88 (11/16 2140) Resp:  [18-20] 18 (11/16 2140) BP: (137-158)/(78) 137/78 (11/16 2140) SpO2:  [99 %-100 %] 100 % (11/16 2140) Weight:  [122.5 kg-124.7 kg] 122.5 kg (11/16 1925)   Physical Exam Constitutional:      Appearance: Normal appearance. She is obese.  Cardiovascular:     Rate and Rhythm: Normal rate.  Pulmonary:     Effort: Pulmonary effort is normal.  Abdominal:     Palpations: Abdomen is soft.  Genitourinary:    General: Normal vulva.     Vagina: Bleeding present.     Uterus: Not tender.      Adnexa:        Right: Tenderness present. No fullness.         Left: No tenderness or fullness.    Musculoskeletal:        General: Normal  range of motion.     Cervical back: Normal range of motion.  Skin:    General: Skin is warm and dry.     Capillary Refill: Capillary refill takes less than 2 seconds.  Neurological:     Mental Status: She is alert and oriented to person, place, and time.  Psychiatric:        Mood and Affect: Mood normal.      Labs/Studies:   CBC and Coags:  Lab Results  Component Value Date   WBC 8.0 09/19/2022   NEUTOPHILPCT 60 06/27/2020   EOSPCT 4 06/27/2020   BASOPCT 1 06/27/2020   LYMPHOPCT 27 06/27/2020   HGB 6.3 (L) 09/19/2022   HCT 19.9 (L) 09/19/2022   MCV 79.3 (L) 09/19/2022   PLT 220 09/19/2022   CMP:  Lab Results  Component Value Date   NA 138 09/19/2022   K 3.3 (L) 09/19/2022   CL 105 09/19/2022   CO2 28 09/19/2022   BUN 9 09/19/2022   CREATININE 0.57 09/19/2022   CREATININE 0.43 (L) 04/13/2022   CREATININE 0.50 04/12/2022   PROT 6.4 (L) 09/19/2022   BILITOT 0.5 09/19/2022   ALT 13 09/19/2022   AST 18 09/19/2022   ALKPHOS 51 09/19/2022    TVUS:  Pending    Assessment / Plan:   Sierra Ibarra is a 36 y.o. [id number] . who presents with acute onset menorrhagia.   1.) Menorrhagia -Discussed plan of care with Dr. Jean Rosenthal -[facility]ation to [facility]  -Will give 1 dose of TXA now  -Provera 20mg  PO TID for up to 7 days  -TVUS pending  -NPO  -Activity as tolerated  -VS per unit routine   2.) Acute blood loss anemia  -Discussed the risk and benefits of blood transfusion.  [name] consents to blood transfusion.  -1 unit pRBC ordered  -CBC post-transfusion - will plan for AM labs  -Continuous IV fluids ordered    Thank you for the opportunity to be involved with this patient's care.  ----- , CNM Midwife [facility], Department of OB/GYN [facility]

## 2022-09-20 LAB — COMPREHENSIVE METABOLIC PANEL
ALT: 13 U/L (ref 0–44)
AST: 15 U/L (ref 15–41)
Albumin: 2.9 g/dL — ABNORMAL LOW (ref 3.5–5.0)
Alkaline Phosphatase: 42 U/L (ref 38–126)
Anion gap: 4 — ABNORMAL LOW (ref 5–15)
BUN: 12 mg/dL (ref 6–20)
CO2: 27 mmol/L (ref 22–32)
Calcium: 8.1 mg/dL — ABNORMAL LOW (ref 8.9–10.3)
Chloride: 110 mmol/L (ref 98–111)
Creatinine, Ser: 0.58 mg/dL (ref 0.44–1.00)
GFR, Estimated: >60 mL/min (ref >60)
Glucose, Bld: 134 mg/dL — ABNORMAL HIGH (ref 70–99)
Potassium: 3.8 mmol/L (ref 3.5–5.1)
Sodium: 141 mmol/L (ref 135–145)
Total Bilirubin: 0.5 mg/dL (ref 0.3–1.2)
Total Protein: 5.8 g/dL — ABNORMAL LOW (ref 6.5–8.1)

## 2022-09-20 LAB — CBC
HCT: 20.6 % — ABNORMAL LOW (ref 36.0–46.0)
Hemoglobin: 6.8 g/dL — ABNORMAL LOW (ref 12.0–15.0)
MCH: 26.6 pg (ref 26.0–34.0)
MCHC: 33 g/dL (ref 30.0–36.0)
MCV: 80.5 fL (ref 80.0–100.0)
Platelets: 201 10*3/uL (ref 150–400)
RBC: 2.56 MIL/uL — ABNORMAL LOW (ref 3.87–5.11)
RDW: 14.2 % (ref 11.5–15.5)
WBC: 7 10*3/uL (ref 4.0–10.5)
nRBC: 0.3 % — ABNORMAL HIGH (ref 0.0–0.2)

## 2022-09-20 LAB — URINALYSIS, ROUTINE W REFLEX MICROSCOPIC
Bacteria, UA: NONE SEEN
Bilirubin Urine: NEGATIVE
Glucose, UA: NEGATIVE mg/dL
Ketones, ur: NEGATIVE mg/dL
Leukocytes,Ua: NEGATIVE
Nitrite: NEGATIVE
Protein, ur: NEGATIVE mg/dL
RBC / HPF: >50 RBC/hpf — ABNORMAL HIGH (ref 0–5)
Specific Gravity, Urine: 1.009 (ref 1.005–1.030)
pH: 6 (ref 5.0–8.0)

## 2022-09-20 LAB — PREPARE RBC (CROSSMATCH)

## 2022-09-20 MED ORDER — SODIUM CHLORIDE 0.9 % IV SOLN
300.0000 mg | Freq: Once | INTRAVENOUS | Status: AC
  Administered 2022-09-20: 300 mg via INTRAVENOUS
  Filled 2022-09-20: qty 300

## 2022-09-20 MED ORDER — MEDROXYPROGESTERONE ACETATE 10 MG PO TABS: 20.0000 mg | ORAL_TABLET | Freq: Three times a day (TID) | ORAL | 0 refills | Status: DC

## 2022-09-20 MED ORDER — SODIUM CHLORIDE 0.9% IV SOLUTION: Freq: Once | INTRAVENOUS | Status: AC

## 2022-09-20 NOTE — Progress Notes (Signed)
Patient discharged home with family.  Discharge instructions, when to follow up, and prescriptions reviewed with patient.  Patient verbalized understanding. Patient will be escorted out by auxiliary.   

## 2022-09-20 NOTE — Discharge Summary (Signed)
Subjective: The patient is doing well.  No nausea or vomiting. Feels much improved from yesterday. 2nd upRBCs infusing now.  Objective: Vital signs in last 24 hours: Temp:  [97.7 F (36.5 C)-99.2 F (37.3 C)] 98.4 F (36.9 C) (11/17 0843) Pulse Rate:  [71-108] 74 (11/17 0843) Resp:  [17-21] 17 (11/17 0843) BP: (111-158)/(53-78) 111/53 (11/17 0843) SpO2:  [91 %-100 %] 99 % (11/17 0843) Weight:  [122.5 kg-124.7 kg] 122.5 kg (11/16 1925)  Intake/Output  Intake/Output Summary (Last 24 hours) at 09/20/2022 0919 Last data filed at 09/20/2022 2263 Gross per 24 hour  Intake 417.5 ml  Output 850 ml  Net -432.5 ml     Lab Results: Recent Labs    09/19/22 1938 09/20/22 0657  HGB 6.3* 6.8*  HCT 19.9* 20.6*  WBC 8.0 7.0  PLT 220 201                 Results for orders placed or performed during the hospital encounter of 09/19/22 (from the past 24 hour(s))  Type and screen [facility]     Status: None (Preliminary result)   Collection Time: 09/19/22  7:37 PM  Result Value Ref Range   ABO/RH(D) O POS    Antibody Screen NEG    Sample Expiration 09/22/2022,2359    Unit Number [id number]    Blood Component Type RED CELLS,LR    Unit division 00    Status of Unit ISSUED,FINAL    Transfusion Status OK TO TRANSFUSE    Crossmatch Result Compatible    Unit Number [id number]    Blood Component Type RBC LR PHER1    Unit division 00    Status of Unit ISSUED    Transfusion Status OK TO TRANSFUSE    Crossmatch Result      Compatible Performed at [facility], [address] [place], [place] [address]   CBC     Status: Abnormal   Collection Time: 09/19/22  7:38 PM  Result Value Ref Range   WBC 8.0 4.0 - 10.5 K/uL   RBC 2.51 (L) 3.87 - 5.11 MIL/uL   Hemoglobin 6.3 (L) 12.0 - 15.0 g/dL   HCT 59.7 (L) 41.6 - 38.4 %   MCV 79.3 (L) 80.0 - 100.0 fL   MCH 25.1 (L) 26.0 - 34.0 pg   MCHC 31.7 30.0 - 36.0 g/dL   RDW 53.6 46.8 - 03.2 %   Platelets  220 150 - 400 K/uL   nRBC 0.4 (H) 0.0 - 0.2 %  hCG, quantitative, pregnancy     Status: None   Collection Time: 09/19/22  7:38 PM  Result Value Ref Range   hCG, Beta Chain, Quant, S <1 <5 mIU/mL  Comprehensive metabolic panel     Status: Abnormal   Collection Time: 09/19/22  7:38 PM  Result Value Ref Range   Sodium 138 135 - 145 mmol/L   Potassium 3.3 (L) 3.5 - 5.1 mmol/L   Chloride 105 98 - 111 mmol/L   CO2 28 22 - 32 mmol/L   Glucose, Bld 134 (H) 70 - 99 mg/dL   BUN 9 6 - 20 mg/dL   Creatinine, Ser 1.22 0.44 - 1.00 mg/dL   Calcium 8.0 (L) 8.9 - 10.3 mg/dL   Total Protein 6.4 (L) 6.5 - 8.1 g/dL   Albumin 3.2 (L) 3.5 - 5.0 g/dL   AST 18 15 - 41 U/L   ALT 13 0 - 44 U/L   Alkaline Phosphatase 51 38 - 126 U/L  Total Bilirubin 0.5 0.3 - 1.2 mg/dL   GFR, Estimated >68 >11 mL/min   Anion gap 5 5 - 15  Prepare RBC (crossmatch)     Status: None   Collection Time: 09/19/22  8:55 PM  Result Value Ref Range   Order Confirmation      ORDER PROCESSED BY BLOOD BANK Performed at [facility], [address] [place], [place] [address]   ABO/Rh     Status: None   Collection Time: 09/19/22  9:38 PM  Result Value Ref Range   ABO/RH(D)      O POS Performed at [facility], [address] [place], [place] [address]   Urinalysis, Routine w reflex microscopic     Status: Abnormal   Collection Time: 09/19/22 11:51 PM  Result Value Ref Range   Color, Urine YELLOW (A) YELLOW   APPearance CLEAR (A) CLEAR   Specific Gravity, Urine 1.009 1.005 - 1.030   pH 6.0 5.0 - 8.0   Glucose, UA NEGATIVE NEGATIVE mg/dL   Hgb urine dipstick LARGE (A) NEGATIVE   Bilirubin Urine NEGATIVE NEGATIVE   Ketones, ur NEGATIVE NEGATIVE mg/dL   Protein, ur NEGATIVE NEGATIVE mg/dL   Nitrite NEGATIVE NEGATIVE   Leukocytes,Ua NEGATIVE NEGATIVE   RBC / HPF >50 (H) 0 - 5 RBC/hpf   WBC, UA 0-5 0 - 5 WBC/hpf   Bacteria, UA NONE SEEN NONE SEEN   Squamous Epithelial / LPF 0-5 0 - 5   Mucus PRESENT    CBC     Status: Abnormal   Collection Time: 09/20/22  6:57 AM  Result Value Ref Range   WBC 7.0 4.0 - 10.5 K/uL   RBC 2.56 (L) 3.87 - 5.11 MIL/uL   Hemoglobin 6.8 (L) 12.0 - 15.0 g/dL   HCT 74.1 (L) 63.8 - 45.3 %   MCV 80.5 80.0 - 100.0 fL   MCH 26.6 26.0 - 34.0 pg   MCHC 33.0 30.0 - 36.0 g/dL   RDW 64.6 80.3 - 21.2 %   Platelets 201 150 - 400 K/uL   nRBC 0.3 (H) 0.0 - 0.2 %  Comprehensive metabolic panel     Status: Abnormal   Collection Time: 09/20/22  6:57 AM  Result Value Ref Range   Sodium 141 135 - 145 mmol/L   Potassium 3.8 3.5 - 5.1 mmol/L   Chloride 110 98 - 111 mmol/L   CO2 27 22 - 32 mmol/L   Glucose, Bld 134 (H) 70 - 99 mg/dL   BUN 12 6 - 20 mg/dL   Creatinine, Ser 2.48 0.44 - 1.00 mg/dL   Calcium 8.1 (L) 8.9 - 10.3 mg/dL   Total Protein 5.8 (L) 6.5 - 8.1 g/dL   Albumin 2.9 (L) 3.5 - 5.0 g/dL   AST 15 15 - 41 U/L   ALT 13 0 - 44 U/L   Alkaline Phosphatase 42 38 - 126 U/L   Total Bilirubin 0.5 0.3 - 1.2 mg/dL   GFR, Estimated >25 >00 mL/min   Anion gap 4 (L) 5 - 15  Prepare RBC (crossmatch)     Status: None   Collection Time: 09/20/22  8:00 AM  Result Value Ref Range   Order Confirmation      ORDER PROCESSED BY BLOOD BANK Performed at [facility], [address] [place], [place] [address]     Assessment/Plan:    - Severe and acute blood loss anemia - Right ovarian cyst, complex but not alarming - Plan for 2u pRBCs  and 300mg  iv iron - provera 10mg  TID x10 days - f/u in office to consider next steps. No surgery indicated currently, but will need to repeat u/s in 6 months to monitor large ovarian cyst. - plan for hormonal menorrhagia management. Can discuss surgery if desired. - she does have a f/u appointment with already scheduled for menorrhagia management -Discharge home today anticipated   , MD   LOS: 0 days   Ulyses Southward 09/20/2022, 9:19 AM

## 2022-09-20 NOTE — H&P (Signed)
Consult History and Physical   SERVICE: Gynecology   Patient Name: Sierra Ibarra Patient MRN:   [id number]  CC: heavy menstrual bleeding   HPI: Sierra Ibarra is a 36 y.o. [id number] with menorrhagia.  Sierra Ibarra states her menstrual cycle started on 09/15/2022 and has been heavy since.  She's been bleeding through a super tampon and pad every hour and sometimes has to change it twice an hour.  She started to feel light headed and dizzy today and presented to the ED for evaluation.  Sierra Ibarra reports her menstrual cycles are typically monthly, every 28-30 days, lasts 3-4 days, and are typically light to moderate.  Her cycle that started on 08/13/2022 was her first heavy menstrual cycle.  She was seen by Haroldine Laws, CNM on 09/12/2022 and had labs done for AUB and an [place] was scheduled for follow up visit.  She denies any significant changes in her health, medications, or weight in the past month.  She reports a BTL that was done after her 2nd child was born.  She was not on any hormonal contraception for her menstrual cycle.    Review of Systems: positives in bold Review of Systems  Constitutional:  Negative for fever, malaise/fatigue and weight loss.  Eyes:  Negative for blurred vision.  Respiratory:  Negative for cough, shortness of breath and wheezing.   Cardiovascular:  Negative for chest pain.  Gastrointestinal:  Negative for abdominal pain, nausea and vomiting.  Genitourinary:  Negative for dysuria, frequency and urgency.       Heavy menstrual cycle with large clots   Musculoskeletal:  Negative for back pain and myalgias.  Neurological:  Positive for dizziness. Negative for headaches.  Psychiatric/Behavioral:  Negative for depression. The patient is not nervous/anxious.  Past Obstetrical History: OB History     Gravida  2   Para  2   Term  2   Preterm      AB      Living  2      SAB      IAB      Ectopic      Multiple      Live Births  2            Past Gynecologic History: Patient's last menstrual period was 09/15/2022. Menstrual frequency Q 28-30 days lasting 3-4 days, overall light to moderate.  Last 2 menstrual cycles were heavier requiring 1-2 pads/hour,  and soaking through pads at night.    Past Medical History: History reviewed. No pertinent past medical history.  Past Surgical History:   Past Surgical History:  Procedure Laterality Date   CESAREAN SECTION     CHOLECYSTECTOMY      Family History: family history reviewed, non-contributory   Social History:  reports that she has never smoked. She has never used smokeless tobacco. She reports current alcohol use. She reports current drug use. Drug: Marijuana.    Home Medications:  Medications reconciled in EPIC  No current facility-administered medications on file prior to encounter.   Current Outpatient Medications on File Prior to Encounter  Medication Sig Dispense Refill   Multiple Vitamin (MULTI-VITAMIN) tablet Take 1 tablet by mouth daily.     venlafaxine XR (EFFEXOR-XR) 75 MG 24 hr capsule Take by mouth.      Allergies:  Allergies  Allergen Reactions   Amoxicillin    Penicillins     Physical Exam:  Temp:  [97.7 F (36.5 C)-99.2 F (37.3 C)] 97.7 F (36.5 C) (11/17 0300) Pulse  Rate:  [78-108] 78 (11/17 0300) Resp:  [18-21] 21 (11/17 0300) BP: (113-158)/(63-78) 113/71 (11/17 0300) SpO2:  [91 %-100 %] 91 % (11/17 0300) Weight:  [122.5 kg-124.7 kg] 122.5 kg (11/16 1925)  Physical Exam  Constitutional:      Appearance: Normal appearance. She is obese.  Cardiovascular:     Rate and Rhythm: Normal rate.  Pulmonary:     Effort: Pulmonary effort is normal.  Abdominal:     Palpations: Abdomen is soft.  Genitourinary:    General: Normal vulva.     Vagina: Bleeding present.     Uterus: Not tender.      Adnexa:        Right: Tenderness present. No fullness.         Left: No tenderness or fullness.    Musculoskeletal:        General: Normal  range of motion.     Cervical back: Normal range of motion.  Skin:    General: Skin is warm and dry.     Capillary Refill: Capillary refill takes less than 2 seconds.  Neurological:     Mental Status: She is alert and oriented to person, place, and time.  Psychiatric:        Mood and Affect: Mood normal.      Labs/Studies:   CBC and Coags:  Lab Results  Component Value Date   WBC 7.0 09/20/2022   NEUTOPHILPCT 60 06/27/2020   EOSPCT 4 06/27/2020   BASOPCT 1 06/27/2020   LYMPHOPCT 27 06/27/2020   HGB 6.8 (L) 09/20/2022   HCT 20.6 (L) 09/20/2022   MCV 80.5 09/20/2022   PLT 201 09/20/2022   CMP:  Lab Results  Component Value Date   NA 138 09/19/2022   K 3.3 (L) 09/19/2022   CL 105 09/19/2022   CO2 28 09/19/2022   BUN 9 09/19/2022   CREATININE 0.57 09/19/2022   CREATININE 0.43 (L) 04/13/2022   CREATININE 0.50 04/12/2022   PROT 6.4 (L) 09/19/2022   BILITOT 0.5 09/19/2022   ALT 13 09/19/2022   AST 18 09/19/2022   ALKPHOS 51 09/19/2022    TVUS:   US PELVIC COMPLETE WITH TRANSVAGINAL  Result Date: 09/19/2022 CLINICAL DATA:  Heavy vaginal bleeding EXAM: TRANSABDOMINAL AND TRANSVAGINAL ULTRASOUND OF PELVIS TECHNIQUE: Both transabdominal and transvaginal ultrasound examinations of the pelvis were performed. Transabdominal technique was performed for global imaging of the pelvis including uterus, ovaries, adnexal regions, and pelvic cul-de-sac. It was necessary to proceed with endovaginal exam following the transabdominal exam to visualize the uterus endometrium ovaries. COMPARISON:  None Available. FINDINGS: Uterus Measurements: 9 x 5.6 x 5 cm = volume: 130.9 mL. No fibroids or other mass visualized. Endometrium Thickness: 12 mm.  No focal abnormality visualized. Right ovary Measurements: 7.1 x 4.1 x 7.2 cm = volume: 108.5 mL. Complex right adnexal cyst containing a few thin septations, this measures 4.5 x 4.3 x 6.7 cm. Left ovary Measurements: 3.7 x 1.7 x 2.8 cm = volume: 9.2  mL. Complex cyst containing homogeneous low level internal echoes, this measures 1.8 x 2.6 x 1.6 cm. Other findings No abnormal free fluid. IMPRESSION: 1. Endometrial thickness within normal limits for premenopausal patient 2. 6.7 cm complex right ovarian cyst seen on transabdominal views, containing a few thin septations. Surgical consultation is recommended 3. 2.6 cm complex cyst in the left ovary with homogeneous low level internal echoes suggesting complex cyst versus small endometrial material recommend 6-12 week sonographic follow-up Electronically Signed   By: Selena Batten  Jake Samples M.D.   On: 09/19/2022 23:09     Assessment / Plan:   Sierra Ibarra is a 36 y.o. [id number] who presents with acute blood loss anemia d/t menorrhagia    1.) Menorrhagia -Discussed plan of care with Dr. Jean Rosenthal -[facility]  -1 dose of TXA given  -Provera 20mg  PO TID for up to 7 days  -Endometrium thickened to 12 mm - no fibroids or massess noted  -NPO  -Activity as tolerated  -VS per unit routine    2.) Acute blood loss anemia  -s/p 1 unit pRBC  -post-transfusion CBC pending  -Continuous IV fluids ordered   3) Complex ovarian cyst -Right adnexal tenderness noted during exam and incidental 6.7 cm right complex ovarian cyst seen on  -Sierra Ibarra does not have pain except with palpation  -Will discuss further management and follow up with Dr. [place] concerning expectant versus surgical management of ovarian cyst.    ----- Dalbert Garnet, CNM Midwife [facility], Department of OB/GYN [facility]

## 2022-09-21 LAB — ESTRADIOL: Estradiol: 18.4 pg/mL

## 2022-09-21 LAB — TYPE AND SCREEN
ABO/RH(D): O POS
Antibody Screen: NEGATIVE
Unit division: 0
Unit division: 0

## 2022-09-21 LAB — BPAM RBC
Blood Product Expiration Date: 202312202359
Blood Product Expiration Date: 202312202359
ISSUE DATE / TIME: 202311162358
ISSUE DATE / TIME: 202311170814
Unit Type and Rh: 5100
Unit Type and Rh: 5100

## 2022-09-23 LAB — INHIBIN A: Inhibin-A: 1.6 pg/mL

## 2022-09-23 LAB — INHIBIN B: Inhibin B: 17 pg/mL

## 2022-10-04 ENCOUNTER — Ambulatory Visit
Admission: RE | Admit: 2022-10-04 | Discharge: 2022-10-04 | Disposition: A | Discharge status: Home / Self Care | Payer: Medicaid Other | Source: Ambulatory Visit | Attending: Obstetrics and Gynecology | Admitting: Obstetrics and Gynecology

## 2022-10-04 ENCOUNTER — Other Ambulatory Visit: Payer: Self-pay | Admitting: Obstetrics and Gynecology

## 2022-10-04 DIAGNOSIS — D509 Iron deficiency anemia, unspecified: Secondary | ICD-10-CM | POA: Insufficient documentation

## 2022-10-04 MED ORDER — SODIUM CHLORIDE 0.9 % IV SOLN
300.0000 mg | INTRAVENOUS | Status: DC
  Administered 2022-10-04: 300 mg via INTRAVENOUS
  Filled 2022-10-04: qty 300

## 2022-10-04 MED ORDER — SODIUM CHLORIDE 0.9 % IV SOLN: 300.0000 mg | INTRAVENOUS | 2 refills | Status: AC

## 2022-10-04 NOTE — Progress Notes (Signed)
ok 

## 2022-10-11 ENCOUNTER — Ambulatory Visit
Admission: RE | Admit: 2022-10-11 | Discharge: 2022-10-11 | Disposition: A | Discharge status: Home / Self Care | Payer: Medicaid Other | Source: Ambulatory Visit | Attending: Obstetrics and Gynecology | Admitting: Obstetrics and Gynecology

## 2022-10-11 DIAGNOSIS — D649 Anemia, unspecified: Secondary | ICD-10-CM | POA: Insufficient documentation

## 2022-10-11 DIAGNOSIS — N920 Excessive and frequent menstruation with regular cycle: Secondary | ICD-10-CM | POA: Diagnosis not present

## 2022-10-11 MED ORDER — SODIUM CHLORIDE 0.9 % IV SOLN
300.0000 mg | Freq: Once | INTRAVENOUS | Status: AC
  Administered 2022-10-11: 300 mg via INTRAVENOUS
  Filled 2022-10-11: qty 300

## 2022-10-18 ENCOUNTER — Ambulatory Visit
Admission: RE | Admit: 2022-10-18 | Discharge: 2022-10-18 | Disposition: A | Discharge status: Home / Self Care | Payer: Medicaid Other | Source: Ambulatory Visit | Attending: Obstetrics and Gynecology | Admitting: Obstetrics and Gynecology

## 2022-10-18 DIAGNOSIS — D509 Iron deficiency anemia, unspecified: Secondary | ICD-10-CM | POA: Insufficient documentation

## 2022-10-18 MED ORDER — SODIUM CHLORIDE 0.9 % IV SOLN
300.0000 mg | Freq: Once | INTRAVENOUS | Status: AC
  Administered 2022-10-18: 300 mg via INTRAVENOUS
  Filled 2022-10-18: qty 300

## 2024-03-30 ENCOUNTER — Inpatient Hospital Stay

## 2024-03-30 ENCOUNTER — Inpatient Hospital Stay: Admitting: Oncology

## 2024-04-05 ENCOUNTER — Encounter: Payer: Self-pay | Admitting: Oncology

## 2024-04-05 ENCOUNTER — Inpatient Hospital Stay

## 2024-04-05 ENCOUNTER — Inpatient Hospital Stay: Attending: Oncology | Admitting: Oncology

## 2024-04-05 VITALS — BP 141/70 | HR 73 | Temp 98.3°F | Resp 16 | Ht 62.0 in | Wt 255.4 lb

## 2024-04-05 DIAGNOSIS — Z9049 Acquired absence of other specified parts of digestive tract: Secondary | ICD-10-CM | POA: Diagnosis not present

## 2024-04-05 DIAGNOSIS — D509 Iron deficiency anemia, unspecified: Secondary | ICD-10-CM | POA: Insufficient documentation

## 2024-04-05 DIAGNOSIS — Z880 Allergy status to penicillin: Secondary | ICD-10-CM | POA: Insufficient documentation

## 2024-04-05 DIAGNOSIS — Z793 Long term (current) use of hormonal contraceptives: Secondary | ICD-10-CM | POA: Diagnosis not present

## 2024-04-05 DIAGNOSIS — K30 Functional dyspepsia: Secondary | ICD-10-CM | POA: Diagnosis not present

## 2024-04-05 DIAGNOSIS — Z79899 Other long term (current) drug therapy: Secondary | ICD-10-CM | POA: Insufficient documentation

## 2024-04-05 NOTE — Progress Notes (Signed)
 [facility]  [phone]) [phone] [phone]  ID: Roylene Corn OB: 04/16/1986  MR#: [id number]  AOZ#:[id number]  Patient Care Team: [facility] as PCP - General Adrian Alba Deadra Everts, MD as Consulting Physician (Oncology)  CHIEF COMPLAINT: Iron  deficiency anemia.  INTERVAL HISTORY: Patient is a 38 year old female with a longstanding history of iron  deficiency anemia who is required both blood and iron  infusions in the past.  She takes oral iron  supplementation, but admits she is noncompliant secondary to GI upset.  She has not complained of any weakness or fatigue today.  She has no neurologic complaints.  She denies any recent fevers or illnesses.  She has a good appetite and denies weight loss.  She has no chest pain, shortness of breath, cough, or hemoptysis.  She denies any nausea, vomiting, constipation, or diarrhea.  She has no melena or hematochezia.  She has no urinary complaints.  Patient offers no further specific complaints today.  REVIEW OF SYSTEMS:   Review of Systems  Constitutional: Negative.  Negative for fever, malaise/fatigue and weight loss.  Respiratory: Negative.  Negative for cough, hemoptysis and shortness of breath.   Cardiovascular: Negative.  Negative for chest pain and leg swelling.  Gastrointestinal: Negative.  Negative for abdominal pain, blood in stool and melena.  Genitourinary: Negative.  Negative for dysuria.  Musculoskeletal: Negative.  Negative for back pain.  Skin: Negative.  Negative for rash.  Neurological: Negative.  Negative for dizziness, focal weakness, weakness and headaches.  Psychiatric/Behavioral: Negative.  The patient is not nervous/anxious.     As per HPI. Otherwise, a complete review of systems is negative.  PAST MEDICAL HISTORY: History reviewed. No pertinent past medical history.  PAST SURGICAL HISTORY: Past Surgical History:  Procedure Laterality Date   CESAREAN SECTION      CHOLECYSTECTOMY      FAMILY HISTORY: History reviewed. No pertinent family history.  ADVANCED DIRECTIVES (Y/N):  N  HEALTH MAINTENANCE: Social History   Tobacco Use   Smoking status: Never   Smokeless tobacco: Never  Vaping Use   Vaping status: Never Used  Substance Use Topics   Alcohol use: Yes    Comment: occasional    Drug use: Not Currently    Types: Marijuana     Colonoscopy:  PAP:  Bone density:  Lipid panel:  Allergies  Allergen Reactions   Amoxicillin    Penicillins     Current Outpatient Medications  Medication Sig Dispense Refill   ferrous sulfate 325 (65 FE) MG EC tablet Take 325 mg by mouth daily. Every 2-3 days     venlafaxine XR (EFFEXOR-XR) 75 MG 24 hr capsule Take by mouth.     medroxyPROGESTERone  (PROVERA ) 10 MG tablet Take 2 tablets (20 mg total) by mouth 3 (three) times daily for 10 days. 60 tablet 0   Multiple Vitamin (MULTI-VITAMIN) tablet Take 1 tablet by mouth daily. (Patient not taking: Reported on 04/05/2024)     No current facility-administered medications for this visit.    OBJECTIVE: Vitals:   04/05/24 1336  BP: (!) 141/70  Pulse: 73  Resp: 16  Temp: 98.3 F (36.8 C)  SpO2: 99%     Body mass index is 46.71 kg/m.    ECOG FS:0 - Asymptomatic  General: Well-developed, well-nourished, no acute distress. Eyes: Pink conjunctiva, anicteric sclera. HEENT: Normocephalic, moist mucous membranes. Lungs: No audible wheezing or coughing. Heart: Regular rate and rhythm. Abdomen: Soft, nontender, no obvious distention. Musculoskeletal: No edema, cyanosis, or clubbing. Neuro: Alert, answering all  questions appropriately. Cranial nerves grossly intact. Skin: No rashes or petechiae noted. Psych: Normal affect. Lymphatics: No cervical, calvicular, axillary or inguinal LAD.   LAB RESULTS:  Lab Results  Component Value Date   NA 141 09/20/2022   K 3.8 09/20/2022   CL 110 09/20/2022   CO2 27 09/20/2022   GLUCOSE 134 (H) 09/20/2022    BUN 12 09/20/2022   CREATININE 0.58 09/20/2022   CALCIUM 8.1 (L) 09/20/2022   PROT 5.8 (L) 09/20/2022   ALBUMIN 2.9 (L) 09/20/2022   AST 15 09/20/2022   ALT 13 09/20/2022   ALKPHOS 42 09/20/2022   BILITOT 0.5 09/20/2022   GFRNONAA >60 09/20/2022   GFRAA >60 06/27/2020    Lab Results  Component Value Date   WBC 7.0 09/20/2022   NEUTROABS 2.3 06/27/2020   HGB 6.8 (L) 09/20/2022   HCT 20.6 (L) 09/20/2022   MCV 80.5 09/20/2022   PLT 201 09/20/2022     STUDIES: No results found.  ASSESSMENT: Iron  deficiency anemia.  PLAN:    Deficiency anemia: Likely secondary to heavy menses.  Patient reports that she has had blood transfusions and iron  infusions in the past.  She cannot tolerate oral iron  supplementation.  Her hemoglobin and iron  stores from outside laboratory work are significantly decreased and she will benefit from 200 mg IV Venofer .  Return to clinic 5 times over the next 1 to 2 weeks for treatment.  Patient will then return to clinic in 3 months with repeat laboratory work, further evaluation, and continuation of treatment if needed.  I spent a total of 45 minutes reviewing chart data, face-to-face evaluation with the patient, counseling and coordination of care as detailed above.   Patient expressed understanding and was in agreement with this plan. She also understands that She can call clinic at any time with any questions, concerns, or complaints.    Shellie Dials, MD   04/05/2024 3:10 PM

## 2024-04-07 ENCOUNTER — Inpatient Hospital Stay

## 2024-04-07 VITALS — BP 119/62 | HR 68 | Temp 97.9°F | Resp 16

## 2024-04-07 DIAGNOSIS — D509 Iron deficiency anemia, unspecified: Secondary | ICD-10-CM | POA: Diagnosis not present

## 2024-04-07 MED ORDER — IRON SUCROSE 20 MG/ML IV SOLN
200.0000 mg | Freq: Once | INTRAVENOUS | Status: AC
  Administered 2024-04-07: 200 mg via INTRAVENOUS

## 2024-04-07 NOTE — Patient Instructions (Signed)

## 2024-04-09 ENCOUNTER — Inpatient Hospital Stay

## 2024-04-14 ENCOUNTER — Inpatient Hospital Stay

## 2024-04-21 ENCOUNTER — Inpatient Hospital Stay

## 2024-04-23 ENCOUNTER — Inpatient Hospital Stay

## 2024-04-28 ENCOUNTER — Inpatient Hospital Stay

## 2024-05-05 ENCOUNTER — Encounter: Payer: Self-pay | Admitting: Oncology

## 2024-05-06 ENCOUNTER — Ambulatory Visit

## 2024-06-09 ENCOUNTER — Telehealth: Payer: Self-pay | Admitting: Oncology

## 2024-06-09 ENCOUNTER — Other Ambulatory Visit: Payer: Self-pay | Admitting: *Deleted

## 2024-06-09 DIAGNOSIS — D509 Iron deficiency anemia, unspecified: Secondary | ICD-10-CM

## 2024-06-09 NOTE — Telephone Encounter (Signed)
 Patient called and asked to get iron . She said she had iron  infusions in June that she did not come to but wanted to reschedule. I told her I would reach out to the team and let them know just in case we needed labs again. Please advise.

## 2024-06-10 ENCOUNTER — Inpatient Hospital Stay: Attending: Oncology

## 2024-06-10 DIAGNOSIS — Z9049 Acquired absence of other specified parts of digestive tract: Secondary | ICD-10-CM | POA: Insufficient documentation

## 2024-06-10 DIAGNOSIS — Z79899 Other long term (current) drug therapy: Secondary | ICD-10-CM | POA: Insufficient documentation

## 2024-06-10 DIAGNOSIS — R5383 Other fatigue: Secondary | ICD-10-CM | POA: Insufficient documentation

## 2024-06-10 DIAGNOSIS — D509 Iron deficiency anemia, unspecified: Secondary | ICD-10-CM | POA: Diagnosis present

## 2024-06-10 DIAGNOSIS — Z880 Allergy status to penicillin: Secondary | ICD-10-CM | POA: Diagnosis not present

## 2024-06-10 LAB — CBC WITH DIFFERENTIAL/PLATELET
Abs Immature Granulocytes: 0.01 K/uL (ref 0.00–0.07)
Basophils Absolute: 0 K/uL (ref 0.0–0.1)
Basophils Relative: 1 %
Eosinophils Absolute: 0.4 K/uL (ref 0.0–0.5)
Eosinophils Relative: 6 %
HCT: 34.4 % — ABNORMAL LOW (ref 36.0–46.0)
Hemoglobin: 10.8 g/dL — ABNORMAL LOW (ref 12.0–15.0)
Immature Granulocytes: 0 %
Lymphocytes Relative: 24 %
Lymphs Abs: 1.4 K/uL (ref 0.7–4.0)
MCH: 23.9 pg — ABNORMAL LOW (ref 26.0–34.0)
MCHC: 31.4 g/dL (ref 30.0–36.0)
MCV: 76.1 fL — ABNORMAL LOW (ref 80.0–100.0)
Monocytes Absolute: 0.3 K/uL (ref 0.1–1.0)
Monocytes Relative: 5 %
Neutro Abs: 3.9 K/uL (ref 1.7–7.7)
Neutrophils Relative %: 64 %
Platelets: 227 K/uL (ref 150–400)
RBC: 4.52 MIL/uL (ref 3.87–5.11)
RDW: 16.5 % — ABNORMAL HIGH (ref 11.5–15.5)
WBC: 6.1 K/uL (ref 4.0–10.5)
nRBC: 0 % (ref 0.0–0.2)

## 2024-06-10 LAB — IRON AND TIBC
Iron: 40 ug/dL (ref 28–170)
Saturation Ratios: 10 % — ABNORMAL LOW (ref 10.4–31.8)
TIBC: 421 ug/dL (ref 250–450)
UIBC: 381 ug/dL

## 2024-06-10 LAB — FERRITIN: Ferritin: 5 ng/mL — ABNORMAL LOW (ref 11–307)

## 2024-06-17 ENCOUNTER — Encounter: Payer: Self-pay | Admitting: Oncology

## 2024-06-17 ENCOUNTER — Inpatient Hospital Stay

## 2024-06-17 ENCOUNTER — Inpatient Hospital Stay (HOSPITAL_BASED_OUTPATIENT_CLINIC_OR_DEPARTMENT_OTHER): Admitting: Oncology

## 2024-06-17 VITALS — BP 140/66 | HR 68 | Temp 97.4°F | Resp 18 | Ht 62.0 in | Wt 255.0 lb

## 2024-06-17 VITALS — BP 113/66 | HR 70

## 2024-06-17 DIAGNOSIS — D509 Iron deficiency anemia, unspecified: Secondary | ICD-10-CM

## 2024-06-17 MED ORDER — IRON SUCROSE 20 MG/ML IV SOLN
200.0000 mg | Freq: Once | INTRAVENOUS | Status: AC
  Administered 2024-06-17: 200 mg via INTRAVENOUS
  Filled 2024-06-17: qty 10

## 2024-06-17 NOTE — Progress Notes (Signed)
 Patient is having severe fatigue and tiredness. Nausea everyday. Her hair has been thinning since starting her iron  infusions so she wants to know if that could effect her hair thinning.

## 2024-06-17 NOTE — Progress Notes (Signed)
 [facility]  [phone] [phone]  ID: Sierra Ibarra OB: [birth year]  MR#: [id number]  RDW#:[id number]  Patient Care Team: [facility] as PCP - General Jacobo Evalene PARAS, MD as Consulting Physician (Oncology)  CHIEF COMPLAINT: Iron  deficiency anemia.  INTERVAL HISTORY: Patient returns to clinic today for further evaluation and consideration of additional IV Venofer .  She feels improved since receiving her last infusions, but still has persistent fatigue.  She otherwise feels well.  She has no neurologic complaints.  She denies any recent fevers or illnesses.  She has a good appetite and denies weight loss.  She has no chest pain, shortness of breath, cough, or hemoptysis.  She denies any nausea, vomiting, constipation, or diarrhea.  She has no melena or hematochezia.  She has no urinary complaints.  Patient offers no further specific complaints today.  REVIEW OF SYSTEMS:   Review of Systems  Constitutional: Negative.  Negative for fever, malaise/fatigue and weight loss.  Respiratory: Negative.  Negative for cough, hemoptysis and shortness of breath.   Cardiovascular: Negative.  Negative for chest pain and leg swelling.  Gastrointestinal: Negative.  Negative for abdominal pain, blood in stool and melena.  Genitourinary: Negative.  Negative for dysuria.  Musculoskeletal: Negative.  Negative for back pain.  Skin: Negative.  Negative for rash.  Neurological: Negative.  Negative for dizziness, focal weakness, weakness and headaches.  Psychiatric/Behavioral: Negative.  The patient is not nervous/anxious.     As per HPI. Otherwise, a complete review of systems is negative.  PAST MEDICAL HISTORY: History reviewed. No pertinent past medical history.  PAST SURGICAL HISTORY: Past Surgical History:  Procedure Laterality Date   CESAREAN SECTION     CHOLECYSTECTOMY      FAMILY HISTORY: History reviewed. No pertinent family  history.  ADVANCED DIRECTIVES (Y/N):  N  HEALTH MAINTENANCE: Social History   Tobacco Use   Smoking status: Never   Smokeless tobacco: Never  Vaping Use   Vaping status: Never Used  Substance Use Topics   Alcohol use: Yes    Comment: occasional    Drug use: Not Currently    Types: Marijuana     Colonoscopy:  PAP:  Bone density:  Lipid panel:  Allergies  Allergen Reactions   Amoxicillin    Penicillins     Current Outpatient Medications  Medication Sig Dispense Refill   ferrous sulfate 325 (65 FE) MG EC tablet Take 325 mg by mouth daily. Every 2-3 days     Multiple Vitamin (MULTI-VITAMIN) tablet Take 1 tablet by mouth daily.     medroxyPROGESTERone  (PROVERA ) 10 MG tablet Take 2 tablets (20 mg total) by mouth 3 (three) times daily for 10 days. (Patient not taking: Reported on 06/17/2024) 60 tablet 0   venlafaxine XR (EFFEXOR-XR) 75 MG 24 hr capsule Take by mouth. (Patient not taking: Reported on 06/17/2024)     No current facility-administered medications for this visit.    OBJECTIVE: Vitals:   06/17/24 0940  BP: (!) 140/66  Pulse: 68  Resp: 18  Temp: (!) 97.4 F (36.3 C)  SpO2: 100%     Body mass index is 46.64 kg/m.    ECOG FS:0 - Asymptomatic  General: Well-developed, well-nourished, no acute distress. Eyes: Pink conjunctiva, anicteric sclera. HEENT: Normocephalic, moist mucous membranes. Lungs: No audible wheezing or coughing. Heart: Regular rate and rhythm. Abdomen: Soft, nontender, no obvious distention. Musculoskeletal: No edema, cyanosis, or clubbing. Neuro: Alert, answering all questions appropriately. Cranial nerves grossly intact. Skin: No rashes or  petechiae noted. Psych: Normal affect.  LAB RESULTS:  Lab Results  Component Value Date   NA 141 09/20/2022   K 3.8 09/20/2022   CL 110 09/20/2022   CO2 27 09/20/2022   GLUCOSE 134 (H) 09/20/2022   BUN 12 09/20/2022   CREATININE 0.58 09/20/2022   CALCIUM 8.1 (L) 09/20/2022   PROT 5.8 (L)  09/20/2022   ALBUMIN 2.9 (L) 09/20/2022   AST 15 09/20/2022   ALT 13 09/20/2022   ALKPHOS 42 09/20/2022   BILITOT 0.5 09/20/2022   GFRNONAA >60 09/20/2022   GFRAA >60 06/27/2020    Lab Results  Component Value Date   WBC 6.1 06/10/2024   NEUTROABS 3.9 06/10/2024   HGB 10.8 (L) 06/10/2024   HCT 34.4 (L) 06/10/2024   MCV 76.1 (L) 06/10/2024   PLT 227 06/10/2024   Lab Results  Component Value Date   IRON  40 06/10/2024   TIBC 421 06/10/2024   IRONPCTSAT 10 (L) 06/10/2024   Lab Results  Component Value Date   FERRITIN 5 (L) 06/10/2024     STUDIES: No results found.  ASSESSMENT: Iron  deficiency anemia.  PLAN:    Deficiency anemia: Likely secondary to heavy menses.  Patient reports that she has had blood transfusions and iron  infusions in the past.  She cannot tolerate oral iron  supplementation.  Patient's hemoglobin and iron  stores have improved, but remain decreased and she is still symptomatic.  Will proceed with IV Venofer  today.  Return to clinic 4 times over the next 1 to 2 weeks for additional treatments.  Patient will then return to clinic in 4 months with repeat laboratory work, further evaluation, and consideration of additional treatment if necessary.    I spent a total of 30 minutes reviewing chart data, face-to-face evaluation with the patient, counseling and coordination of care as detailed above.    Patient expressed understanding and was in agreement with this plan. She also understands that She can call clinic at any time with any questions, concerns, or complaints.    Evalene JINNY Reusing, MD   06/18/2024 12:12 PM

## 2024-06-17 NOTE — Patient Instructions (Signed)

## 2024-06-18 ENCOUNTER — Encounter: Payer: Self-pay | Admitting: Oncology

## 2024-06-21 ENCOUNTER — Inpatient Hospital Stay

## 2024-06-22 ENCOUNTER — Ambulatory Visit

## 2024-06-24 ENCOUNTER — Ambulatory Visit

## 2024-06-28 ENCOUNTER — Ambulatory Visit

## 2024-06-30 ENCOUNTER — Ambulatory Visit

## 2024-07-01 ENCOUNTER — Inpatient Hospital Stay

## 2024-07-01 VITALS — BP 129/64 | HR 63 | Temp 96.6°F | Resp 18

## 2024-07-01 DIAGNOSIS — D509 Iron deficiency anemia, unspecified: Secondary | ICD-10-CM

## 2024-07-01 MED ORDER — IRON SUCROSE 20 MG/ML IV SOLN
200.0000 mg | Freq: Once | INTRAVENOUS | Status: AC
  Administered 2024-07-01: 200 mg via INTRAVENOUS
  Filled 2024-07-01: qty 10

## 2024-07-01 NOTE — Patient Instructions (Signed)

## 2024-07-06 ENCOUNTER — Other Ambulatory Visit

## 2024-07-07 ENCOUNTER — Ambulatory Visit

## 2024-07-07 ENCOUNTER — Ambulatory Visit: Admitting: Oncology

## 2024-07-08 ENCOUNTER — Inpatient Hospital Stay: Attending: Oncology

## 2024-07-08 VITALS — BP 133/66 | HR 76 | Temp 96.9°F | Resp 18

## 2024-07-08 DIAGNOSIS — D509 Iron deficiency anemia, unspecified: Secondary | ICD-10-CM | POA: Diagnosis present

## 2024-07-08 DIAGNOSIS — Z79899 Other long term (current) drug therapy: Secondary | ICD-10-CM | POA: Insufficient documentation

## 2024-07-08 MED ORDER — IRON SUCROSE 20 MG/ML IV SOLN
200.0000 mg | Freq: Once | INTRAVENOUS | Status: AC
  Administered 2024-07-08: 200 mg via INTRAVENOUS
  Filled 2024-07-08: qty 10

## 2024-07-08 NOTE — Patient Instructions (Signed)

## 2024-07-13 ENCOUNTER — Inpatient Hospital Stay

## 2024-07-13 VITALS — BP 127/73 | HR 64 | Resp 18

## 2024-07-13 DIAGNOSIS — D509 Iron deficiency anemia, unspecified: Secondary | ICD-10-CM

## 2024-07-13 MED ORDER — IRON SUCROSE 20 MG/ML IV SOLN
200.0000 mg | Freq: Once | INTRAVENOUS | Status: AC
  Administered 2024-07-13: 200 mg via INTRAVENOUS
  Filled 2024-07-13: qty 10

## 2024-07-13 NOTE — Patient Instructions (Signed)

## 2024-10-08 ENCOUNTER — Telehealth: Payer: Self-pay | Admitting: Oncology

## 2024-10-08 NOTE — Telephone Encounter (Signed)
 Pt called and wants to know if she can have her labs/MD/iron  appt all in the same day so she does not have to take off multiple times from work.  I offered to move the time of the labs to before or after work and pt stated she works 8-5.    Please advise if she can have all of the appts in one day or not. Please call pt back with an update. Pt is aware she might be called on Monday 12/8

## 2024-10-11 ENCOUNTER — Encounter: Payer: Self-pay | Admitting: Oncology

## 2024-10-18 ENCOUNTER — Encounter: Payer: Self-pay | Admitting: Oncology

## 2024-10-18 ENCOUNTER — Other Ambulatory Visit

## 2024-10-18 ENCOUNTER — Inpatient Hospital Stay: Admitting: Oncology

## 2024-10-18 ENCOUNTER — Inpatient Hospital Stay

## 2024-10-18 ENCOUNTER — Inpatient Hospital Stay: Attending: Oncology

## 2024-10-18 VITALS — BP 144/76 | HR 74 | Temp 97.0°F | Resp 19

## 2024-10-18 VITALS — BP 129/77 | HR 74 | Temp 97.9°F | Resp 18 | Ht 62.0 in | Wt 266.0 lb

## 2024-10-18 DIAGNOSIS — Z79899 Other long term (current) drug therapy: Secondary | ICD-10-CM | POA: Insufficient documentation

## 2024-10-18 DIAGNOSIS — D509 Iron deficiency anemia, unspecified: Secondary | ICD-10-CM | POA: Insufficient documentation

## 2024-10-18 DIAGNOSIS — R5383 Other fatigue: Secondary | ICD-10-CM | POA: Diagnosis not present

## 2024-10-18 DIAGNOSIS — Z880 Allergy status to penicillin: Secondary | ICD-10-CM | POA: Insufficient documentation

## 2024-10-18 DIAGNOSIS — Z9049 Acquired absence of other specified parts of digestive tract: Secondary | ICD-10-CM | POA: Diagnosis not present

## 2024-10-18 LAB — CBC WITH DIFFERENTIAL (CANCER CENTER ONLY)
Abs Immature Granulocytes: 0.04 K/uL (ref 0.00–0.07)
Basophils Absolute: 0.1 K/uL (ref 0.0–0.1)
Basophils Relative: 1 %
Eosinophils Absolute: 0.4 K/uL (ref 0.0–0.5)
Eosinophils Relative: 4 %
HCT: 32.9 % — ABNORMAL LOW (ref 36.0–46.0)
Hemoglobin: 11 g/dL — ABNORMAL LOW (ref 12.0–15.0)
Immature Granulocytes: 1 %
Lymphocytes Relative: 22 %
Lymphs Abs: 1.9 K/uL (ref 0.7–4.0)
MCH: 27.8 pg (ref 26.0–34.0)
MCHC: 33.4 g/dL (ref 30.0–36.0)
MCV: 83.1 fL (ref 80.0–100.0)
Monocytes Absolute: 0.4 K/uL (ref 0.1–1.0)
Monocytes Relative: 5 %
Neutro Abs: 6 K/uL (ref 1.7–7.7)
Neutrophils Relative %: 67 %
Platelet Count: 234 K/uL (ref 150–400)
RBC: 3.96 MIL/uL (ref 3.87–5.11)
RDW: 13.1 % (ref 11.5–15.5)
WBC Count: 8.7 K/uL (ref 4.0–10.5)
nRBC: 0 % (ref 0.0–0.2)

## 2024-10-18 LAB — IRON AND TIBC
Iron: 40 ug/dL (ref 28–170)
Saturation Ratios: 12 % (ref 10.4–31.8)
TIBC: 347 ug/dL (ref 250–450)
UIBC: 307 ug/dL

## 2024-10-18 LAB — FERRITIN: Ferritin: 36 ng/mL (ref 11–307)

## 2024-10-18 MED ORDER — SODIUM CHLORIDE 0.9% FLUSH
10.0000 mL | Freq: Once | INTRAVENOUS | Status: AC | PRN
  Administered 2024-10-18: 16:00:00 10 mL
  Filled 2024-10-18: qty 10

## 2024-10-18 MED ORDER — IRON SUCROSE 20 MG/ML IV SOLN
200.0000 mg | Freq: Once | INTRAVENOUS | Status: AC
  Administered 2024-10-18: 16:00:00 200 mg via INTRAVENOUS
  Filled 2024-10-18: qty 10

## 2024-10-18 NOTE — Progress Notes (Unsigned)
 [facility]  [phone] [phone]  ID: Chuck LOISE Slade OB: [birth year]  MR#: [id number]  RDW#:[id number]  Patient Care Team: [facility] as PCP - General Jacobo Evalene PARAS, MD as Consulting Physician (Oncology)  CHIEF COMPLAINT: Iron  deficiency anemia.  INTERVAL HISTORY: Patient returns to clinic today for repeat laboratory work, further evaluation, and consideration of additional IV Venofer .  She continues to have mild fatigue, but otherwise feels well.  She has no neurologic complaints.  She denies any recent fevers or illnesses.  She has a good appetite and denies weight loss.  She has no chest pain, shortness of breath, cough, or hemoptysis.  She denies any nausea, vomiting, constipation, or diarrhea.  She has no melena or hematochezia.  She has no urinary complaints.  Patient offers no further specific complaints today.  REVIEW OF SYSTEMS:   Review of Systems  Constitutional:  Positive for malaise/fatigue. Negative for fever and weight loss.  Respiratory: Negative.  Negative for cough, hemoptysis and shortness of breath.   Cardiovascular: Negative.  Negative for chest pain and leg swelling.  Gastrointestinal: Negative.  Negative for abdominal pain, blood in stool and melena.  Genitourinary: Negative.  Negative for dysuria.  Musculoskeletal: Negative.  Negative for back pain.  Skin: Negative.  Negative for rash.  Neurological: Negative.  Negative for dizziness, focal weakness, weakness and headaches.  Psychiatric/Behavioral: Negative.  The patient is not nervous/anxious.     As per HPI. Otherwise, a complete review of systems is negative.  PAST MEDICAL HISTORY: History reviewed. No pertinent past medical history.  PAST SURGICAL HISTORY: Past Surgical History:  Procedure Laterality Date   CESAREAN SECTION     CHOLECYSTECTOMY      FAMILY HISTORY: History reviewed. No pertinent family history.  ADVANCED DIRECTIVES  (Y/N):  N  HEALTH MAINTENANCE: Social History   Tobacco Use   Smoking status: Never   Smokeless tobacco: Never  Vaping Use   Vaping status: Never Used  Substance Use Topics   Alcohol use: Yes    Comment: occasional    Drug use: Not Currently    Types: Marijuana     Colonoscopy:  PAP:  Bone density:  Lipid panel:  Allergies  Allergen Reactions   Amoxicillin    Penicillins     Current Outpatient Medications  Medication Sig Dispense Refill   Multiple Vitamin (MULTI-VITAMIN) tablet Take 1 tablet by mouth daily.     No current facility-administered medications for this visit.    OBJECTIVE: Vitals:   10/18/24 1524  BP: 129/77  Pulse: 74  Resp: 18  Temp: 97.9 F (36.6 C)  SpO2: 99%     Body mass index is 48.65 kg/m.    ECOG FS:0 - Asymptomatic  General: Well-developed, well-nourished, no acute distress. Eyes: Pink conjunctiva, anicteric sclera. HEENT: Normocephalic, moist mucous membranes. Lungs: No audible wheezing or coughing. Heart: Regular rate and rhythm. Abdomen: Soft, nontender, no obvious distention. Musculoskeletal: No edema, cyanosis, or clubbing. Neuro: Alert, answering all questions appropriately. Cranial nerves grossly intact. Skin: No rashes or petechiae noted. Psych: Normal affect.  LAB RESULTS:  Lab Results  Component Value Date   NA 141 09/20/2022   K 3.8 09/20/2022   CL 110 09/20/2022   CO2 27 09/20/2022   GLUCOSE 134 (H) 09/20/2022   BUN 12 09/20/2022   CREATININE 0.58 09/20/2022   CALCIUM 8.1 (L) 09/20/2022   PROT 5.8 (L) 09/20/2022   ALBUMIN 2.9 (L) 09/20/2022   AST 15 09/20/2022   ALT 13 09/20/2022  ALKPHOS 42 09/20/2022   BILITOT 0.5 09/20/2022   GFRNONAA >60 09/20/2022   GFRAA >60 06/27/2020    Lab Results  Component Value Date   WBC 8.7 10/18/2024   NEUTROABS 6.0 10/18/2024   HGB 11.0 (L) 10/18/2024   HCT 32.9 (L) 10/18/2024   MCV 83.1 10/18/2024   PLT 234 10/18/2024   Lab Results  Component Value Date    IRON  40 10/18/2024   TIBC 347 10/18/2024   IRONPCTSAT 12 10/18/2024   Lab Results  Component Value Date   FERRITIN 36 10/18/2024     STUDIES: No results found.  ASSESSMENT: Iron  deficiency anemia.  PLAN:    Iron  deficiency anemia: Likely secondary to heavy menses.  Patient reports that she has had blood transfusions and iron  infusions in the past.  She cannot tolerate oral iron  supplementation.  Patient's hemoglobin remains decreased, but mildly improved at 11.0.  Iron  stores have improved and are now within normal limits.  She is mildly symptomatic, therefore we will proceed with IV Venofer  today.  Patient will then return to clinic 2 times over the next week for additional infusions.  She will then return to clinic in 4 months with repeat laboratory work, further evaluation, and continuation of treatment if needed.    I spent a total of 30 minutes reviewing chart data, face-to-face evaluation with the patient, counseling and coordination of care as detailed above.   Patient expressed understanding and was in agreement with this plan. She also understands that She can call clinic at any time with any questions, concerns, or complaints.    Evalene JINNY Reusing, MD   10/19/2024 7:22 PM

## 2024-10-18 NOTE — Progress Notes (Unsigned)
 Nausea is constant, but the iron  infusions helped with how severe the nausea was, and they gave her more energy. She is starting to feel more fatigued and tired.

## 2024-10-19 ENCOUNTER — Ambulatory Visit

## 2024-10-19 ENCOUNTER — Ambulatory Visit: Admitting: Oncology

## 2024-10-19 ENCOUNTER — Encounter: Payer: Self-pay | Admitting: Oncology

## 2024-10-25 ENCOUNTER — Inpatient Hospital Stay

## 2024-11-01 ENCOUNTER — Inpatient Hospital Stay

## 2024-11-02 ENCOUNTER — Encounter: Payer: Self-pay | Admitting: Oncology

## 2024-11-09 ENCOUNTER — Inpatient Hospital Stay: Attending: Oncology

## 2024-11-09 ENCOUNTER — Inpatient Hospital Stay

## 2024-11-09 VITALS — BP 126/74 | HR 78 | Temp 97.8°F

## 2024-11-09 DIAGNOSIS — D509 Iron deficiency anemia, unspecified: Secondary | ICD-10-CM | POA: Diagnosis present

## 2024-11-09 DIAGNOSIS — Z79899 Other long term (current) drug therapy: Secondary | ICD-10-CM | POA: Diagnosis not present

## 2024-11-09 MED ORDER — SODIUM CHLORIDE 0.9% FLUSH
10.0000 mL | Freq: Once | INTRAVENOUS | Status: AC | PRN
  Administered 2024-11-09: 10 mL
  Filled 2024-11-09: qty 10

## 2024-11-09 MED ORDER — IRON SUCROSE 20 MG/ML IV SOLN
200.0000 mg | Freq: Once | INTRAVENOUS | Status: AC
  Administered 2024-11-09: 200 mg via INTRAVENOUS
  Filled 2024-11-09: qty 10

## 2024-11-09 NOTE — Patient Instructions (Signed)

## 2024-11-17 ENCOUNTER — Inpatient Hospital Stay

## 2024-11-17 VITALS — BP 141/52 | HR 71 | Temp 96.8°F | Resp 18

## 2024-11-17 DIAGNOSIS — D509 Iron deficiency anemia, unspecified: Secondary | ICD-10-CM

## 2024-11-17 MED ORDER — IRON SUCROSE 20 MG/ML IV SOLN
200.0000 mg | Freq: Once | INTRAVENOUS | Status: AC
  Administered 2024-11-17: 200 mg via INTRAVENOUS

## 2024-11-17 NOTE — Patient Instructions (Signed)

## 2025-02-22 ENCOUNTER — Inpatient Hospital Stay

## 2025-02-23 ENCOUNTER — Inpatient Hospital Stay: Admitting: Oncology

## 2025-02-23 ENCOUNTER — Inpatient Hospital Stay
